# Patient Record
Sex: Female | Born: 1940 | Race: White | Hispanic: No | Smoking: Never smoker
Health system: Southern US, Community
[De-identification: ages and names within clinical notes are randomized; demographics above are authoritative.]

## PROBLEM LIST (undated history)

## (undated) DIAGNOSIS — R413 Other amnesia: Principal | ICD-10-CM

## (undated) DIAGNOSIS — R0789 Other chest pain: Secondary | ICD-10-CM

## (undated) DIAGNOSIS — S8992XA Unspecified injury of left lower leg, initial encounter: Secondary | ICD-10-CM

## (undated) DIAGNOSIS — K219 Gastro-esophageal reflux disease without esophagitis: Secondary | ICD-10-CM

## (undated) DIAGNOSIS — F329 Major depressive disorder, single episode, unspecified: Secondary | ICD-10-CM

## (undated) DIAGNOSIS — K635 Polyp of colon: Secondary | ICD-10-CM

## (undated) DIAGNOSIS — Z9889 Other specified postprocedural states: Secondary | ICD-10-CM

## (undated) DIAGNOSIS — I10 Essential (primary) hypertension: Secondary | ICD-10-CM

## (undated) DIAGNOSIS — M1990 Unspecified osteoarthritis, unspecified site: Secondary | ICD-10-CM

## (undated) DIAGNOSIS — J45909 Unspecified asthma, uncomplicated: Secondary | ICD-10-CM

## (undated) DIAGNOSIS — R112 Nausea with vomiting, unspecified: Secondary | ICD-10-CM

## (undated) DIAGNOSIS — F0390 Unspecified dementia without behavioral disturbance: Secondary | ICD-10-CM

## (undated) DIAGNOSIS — E538 Deficiency of other specified B group vitamins: Secondary | ICD-10-CM

## (undated) DIAGNOSIS — F32A Depression, unspecified: Secondary | ICD-10-CM

## (undated) HISTORY — PX: OTHER SURGICAL HISTORY: SHX169

## (undated) HISTORY — DX: Unspecified asthma, uncomplicated: J45.909

## (undated) HISTORY — DX: Unspecified injury of left lower leg, initial encounter: S89.92XA

## (undated) HISTORY — DX: Polyp of colon: K63.5

## (undated) HISTORY — DX: Other amnesia: R41.3

## (undated) HISTORY — DX: Other chest pain: R07.89

## (undated) HISTORY — DX: Deficiency of other specified B group vitamins: E53.8

## (undated) HISTORY — DX: Unspecified osteoarthritis, unspecified site: M19.90

## (undated) HISTORY — DX: Gastro-esophageal reflux disease without esophagitis: K21.9

---
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
## 1979-06-28 HISTORY — PX: DILATION AND CURETTAGE OF UTERUS: SHX78

## 1993-06-27 HISTORY — PX: CARPAL TUNNEL RELEASE: SHX101

## 1998-01-12 ENCOUNTER — Emergency Department (HOSPITAL_COMMUNITY): Admission: EM | Admit: 1998-01-12 | Discharge: 1998-01-12 | Payer: Self-pay | Admitting: Emergency Medicine

## 1998-06-01 ENCOUNTER — Other Ambulatory Visit: Admission: RE | Admit: 1998-06-01 | Discharge: 1998-06-01 | Payer: Self-pay | Admitting: *Deleted

## 1999-11-17 ENCOUNTER — Other Ambulatory Visit: Admission: RE | Admit: 1999-11-17 | Discharge: 1999-11-17 | Payer: Self-pay | Admitting: *Deleted

## 2000-01-03 ENCOUNTER — Ambulatory Visit (HOSPITAL_COMMUNITY): Admission: RE | Admit: 2000-01-03 | Discharge: 2000-01-03 | Payer: Self-pay | Admitting: Internal Medicine

## 2000-01-19 ENCOUNTER — Ambulatory Visit (HOSPITAL_COMMUNITY): Admission: RE | Admit: 2000-01-19 | Discharge: 2000-01-19 | Payer: Self-pay | Admitting: Cardiovascular Disease

## 2000-03-10 ENCOUNTER — Ambulatory Visit (HOSPITAL_COMMUNITY): Admission: RE | Admit: 2000-03-10 | Discharge: 2000-03-10 | Payer: Self-pay | Admitting: Gastroenterology

## 2000-03-10 ENCOUNTER — Encounter (INDEPENDENT_AMBULATORY_CARE_PROVIDER_SITE_OTHER): Payer: Self-pay | Admitting: Specialist

## 2000-04-27 ENCOUNTER — Encounter: Payer: Self-pay | Admitting: *Deleted

## 2000-04-27 ENCOUNTER — Encounter: Admission: RE | Admit: 2000-04-27 | Discharge: 2000-04-27 | Payer: Self-pay | Admitting: *Deleted

## 2000-05-08 ENCOUNTER — Encounter (INDEPENDENT_AMBULATORY_CARE_PROVIDER_SITE_OTHER): Payer: Self-pay | Admitting: *Deleted

## 2000-05-08 ENCOUNTER — Ambulatory Visit (HOSPITAL_COMMUNITY): Admission: RE | Admit: 2000-05-08 | Discharge: 2000-05-08 | Payer: Self-pay | Admitting: Gastroenterology

## 2000-07-05 ENCOUNTER — Encounter: Admission: RE | Admit: 2000-07-05 | Discharge: 2000-10-03 | Payer: Self-pay | Admitting: Anesthesiology

## 2001-06-05 ENCOUNTER — Encounter: Payer: Self-pay | Admitting: *Deleted

## 2001-06-05 ENCOUNTER — Encounter: Admission: RE | Admit: 2001-06-05 | Discharge: 2001-06-05 | Payer: Self-pay | Admitting: *Deleted

## 2001-06-27 HISTORY — PX: VAGINAL HYSTERECTOMY: SHX2639

## 2002-06-06 ENCOUNTER — Encounter: Admission: RE | Admit: 2002-06-06 | Discharge: 2002-06-06 | Payer: Self-pay | Admitting: Internal Medicine

## 2002-06-06 ENCOUNTER — Encounter (HOSPITAL_BASED_OUTPATIENT_CLINIC_OR_DEPARTMENT_OTHER): Payer: Self-pay | Admitting: Internal Medicine

## 2002-06-07 ENCOUNTER — Encounter: Admission: RE | Admit: 2002-06-07 | Discharge: 2002-06-07 | Payer: Self-pay | Admitting: Internal Medicine

## 2002-06-07 ENCOUNTER — Encounter (HOSPITAL_BASED_OUTPATIENT_CLINIC_OR_DEPARTMENT_OTHER): Payer: Self-pay | Admitting: Internal Medicine

## 2002-12-10 ENCOUNTER — Ambulatory Visit (HOSPITAL_BASED_OUTPATIENT_CLINIC_OR_DEPARTMENT_OTHER): Admission: RE | Admit: 2002-12-10 | Discharge: 2002-12-10 | Payer: Self-pay | Admitting: Orthopedic Surgery

## 2002-12-10 ENCOUNTER — Encounter (INDEPENDENT_AMBULATORY_CARE_PROVIDER_SITE_OTHER): Payer: Self-pay | Admitting: *Deleted

## 2003-12-12 ENCOUNTER — Encounter: Admission: RE | Admit: 2003-12-12 | Discharge: 2003-12-12 | Payer: Self-pay | Admitting: Internal Medicine

## 2003-12-18 ENCOUNTER — Encounter: Admission: RE | Admit: 2003-12-18 | Discharge: 2003-12-18 | Payer: Self-pay | Admitting: Internal Medicine

## 2004-06-27 HISTORY — PX: COLONOSCOPY: SHX174

## 2005-01-13 ENCOUNTER — Encounter: Admission: RE | Admit: 2005-01-13 | Discharge: 2005-01-13 | Payer: Self-pay | Admitting: Internal Medicine

## 2005-12-02 ENCOUNTER — Encounter: Admission: RE | Admit: 2005-12-02 | Discharge: 2005-12-02 | Payer: Self-pay | Admitting: Orthopedic Surgery

## 2005-12-27 ENCOUNTER — Encounter: Admission: RE | Admit: 2005-12-27 | Discharge: 2005-12-27 | Payer: Self-pay | Admitting: Orthopedic Surgery

## 2006-01-26 ENCOUNTER — Encounter: Admission: RE | Admit: 2006-01-26 | Discharge: 2006-01-26 | Payer: Self-pay | Admitting: Internal Medicine

## 2006-05-30 ENCOUNTER — Ambulatory Visit (HOSPITAL_BASED_OUTPATIENT_CLINIC_OR_DEPARTMENT_OTHER): Admission: RE | Admit: 2006-05-30 | Discharge: 2006-05-30 | Payer: Self-pay | Admitting: Orthopedic Surgery

## 2007-03-06 ENCOUNTER — Encounter: Admission: RE | Admit: 2007-03-06 | Discharge: 2007-03-06 | Payer: Self-pay | Admitting: Internal Medicine

## 2008-03-06 ENCOUNTER — Encounter: Admission: RE | Admit: 2008-03-06 | Discharge: 2008-03-06 | Payer: Self-pay | Admitting: Internal Medicine

## 2009-03-09 ENCOUNTER — Encounter: Admission: RE | Admit: 2009-03-09 | Discharge: 2009-03-09 | Payer: Self-pay | Admitting: Internal Medicine

## 2010-03-29 ENCOUNTER — Encounter: Admission: RE | Admit: 2010-03-29 | Discharge: 2010-03-29 | Payer: Self-pay | Admitting: Internal Medicine

## 2010-07-18 ENCOUNTER — Encounter (HOSPITAL_BASED_OUTPATIENT_CLINIC_OR_DEPARTMENT_OTHER): Payer: Self-pay | Admitting: Internal Medicine

## 2010-11-12 NOTE — Procedures (Signed)
[facility]  Patient:    Deborah Moreno, Deborah Moreno                      MRN: [id number] Proc. Date: 07/12/00 Adm. Date:  [id number] Attending:  Thyra Breed CC:         Lunette Stands, M.D.                           Procedure Report  PROCEDURE:  Lumbar epidural steroid injection.  DIAGNOSIS:  Disk protrusion at L4-5 affecting the right nerve root with facet joint arthropathy and degenerative disk bulge.  INTERVAL HISTORY:  The patient has noted some mild improvement after the first injection.  She tolerated the 40 mg well without untoward effects.  PHYSICAL EXAMINATION:  Blood pressure 147/75, heart rate 68, respiratory rate 20, O2 saturations 100%.  Pain level is 7/10, and temperature is 97 degrees. She shows good healing from previous injection site.  DESCRIPTION OF PROCEDURE:  After informed consent was obtained, the patient was placed in a sitting position and monitored.  Her back was prepped with Betadine x 3.  A skin wheal was raised at the L4-L5 interspace with 1% lidocaine.  A 20 gauge Tuohy needle was introduced to the lumbar epidural space to loss of resistance to preservative-free normal saline.  There was no CSF nor blood.  Medrol 80 mg in 6 mL of preservative-free normal saline was gently injected.  The needle was flushed with preservative-free normal saline and removed intact.  Postprocedure condition - stable.  DISCHARGE INSTRUCTIONS: 1. Resume previous diet. 2. Limitations on activities per instruction sheet. 3. Continue on current medications. 4. Follow up with me in one week for a repeat injection. DD:  07/12/00 TD:  07/13/00 Job: [id number]

## 2010-11-12 NOTE — Procedures (Signed)
[facility]  Patient:    Deborah Moreno, Deborah Moreno                      MRN: [id number] Proc. Date: 07/19/00 Adm. Date:  [id number] Attending:  Thyra Breed CC:         Lunette Stands, M.D.   Procedure Report  PROCEDURE:  Lumbar epidural steroid injection.  DIAGNOSIS:  Disk protrusion at L4-5 affecting the right nerve root with facet joint arthropathy and degenerative disk disease.  INTERVAL HISTORY:  The patient has noted mild improvement in her back discomfort after the first two injections but wishes to proceed with the third today.  She rates her pain at 7/10 which is identical to her last visit.  She had no untoward effects.  PHYSICAL EXAMINATION:  Blood pressure 126/46, heart rate 67, respiratory rate 20, O2 saturations 100%.  Pain level is 6-7/10, and temperature is 97.3.  Her back shows good healing from previous injection site.  DESCRIPTION OF PROCEDURE:  After informed consent was obtained, the patient was placed in a sitting position and monitored.  Her back was prepped with Betadine x 3.  A skin wheal was raised at the L4-L5 interspace with 1% lidocaine.  A 20 gauge Tuohy needle was introduced in the lumbar epidural space to loss of resistance to preservative-free normal saline.  There was no CSF nor blood.  Medrol 80 mg in 8 mL of preservative-free normal saline was gently injected.  The needle was flushed with preservative-free normal saline and removed intact.  Postprocedure condition - stable.  DISCHARGE INSTRUCTIONS: 1. Resume previous diet. 2. Limitations on activities per instruction sheet. 3. Continue on current medications. 4. The patient plans to follow up with Dr. Charlett Blake. DD:  07/19/00 TD:  07/19/00 Job: [id number]

## 2010-11-12 NOTE — Procedures (Signed)
[facility]  Patient:    Deborah Moreno, Deborah Moreno                      MRN: [id number] Proc. Date: 03/10/00 Adm. Date:  [id number] Disc. Date: [id number] Attending:  Rich Brave CC:         Barry Dienes. Eloise Harman, M.D.   Procedure Report  PROCEDURE:  Upper endoscopy with Savary dilatation of the esophagus.  INDICATIONS FOR PROCEDURE:  A 70 year old female with recurrent dysphagia symptoms, now several years status post a previous dilatation to 18 mm.  FINDINGS:  Distal esophageal ring dilated to 18 mm.  DESCRIPTION OF PROCEDURE:  The nature, purpose and risk of the procedure were familiar to the patient from her prior procedure and she provided written consent. Sedation was Demerol 50 mg and Versed 5 mg IV without arrhythmias or desaturation. The Olympus video endoscope was passed under direct vision. I did not appreciate any overt laryngeal abnormalities. The esophagus was endoscopically without evidence of reflux esophagitis, free reflux, Barretts esophagus, varices, infection, or neoplasia. There was a fairly widely patent esophageal ring at the squamocolumnar junction and below this was a minimal hiatal hernia. The stomach was entered. It contained no significant residual and had normal mucosa without evidence of gastritis, erosions, ulcers, polyps or masses including retroflexed view of the proximal stomach and the pylorus, duodenal bulb, and second duodenum looked normal.  Savary dilatation was performed in the standard fashion. The spring-tip guidewire was passed through the scope into the antrum of the stomach, the scope was removed in an exchange fashion and a single passage was made with an 18 mm Savary dilator under fluoroscopic guidance, confirming that he wire was in appropriate position and that the widest portion of the dilator went below the level of the diaphragm. There was fairly smooth resistance with passage of the dilator, no  discreet "pop" or "click". The dilator and the guidewire were removed and the patient was then re-endoscoped under direct vision which disclosed some fresh blood coating the distal esophagus consistent with fracture of the ring or dilation of a narrowed area of the esophageal mucosa. There was no endoscopic evidence of undue trauma to the hypopharynx or esophagus. Specifically, I did not see anything suspicious for esophageal perforation.  The scope was then removed from the patient who tolerated the procedure well and without apparent complication.  IMPRESSION:  Esophageal ring dilated to 18 mm as described above.  PLAN:  Clinical follow-up of dysphagia symptoms. DD:  03/10/00 TD:  03/13/00 Job: [id number]

## 2010-11-12 NOTE — Procedures (Signed)
. [facility]  Patient:    Deborah Moreno, Deborah Moreno                      MRN: [id number] Proc. Date: 05/08/00 Adm. Date:  [id number] Disc. Date: [id number] Attending:  Rich Brave CC:         Dr. Brunilda Payor   Procedure Report  PROCEDURE:  Colonoscopy with hot biopsy.  INDICATIONS:  Adenoma seen on screening flexible sigmoidoscopy.  FINDINGS:  A 2-3 mm residual polyp at 38 cm, hot biopsied.  Otherwise normal examination to cecum.  PROCEDURE:  The nature, purpose, and risks of the procedure had been discussed with the patient, who provided written consent.  Sedation fentanyl 70 mcg and Versed 6 mg IV without arrhythmias or desaturation.  The Olympus adjustable-tension video colonoscope was advanced quite easily to the cecum as identified by typical cecal appearance and pullback was then performed in a gradual fashion.  On the way in, there was a 2-3 mm residual polyp at the site of previous biopsies obtained during her screening flexible sigmoidoscopy.  This polyp was hot biopsied with good coagulation, but no evidence of excessive coagulation or bleeding.  The quality of the prep during this examination was excellent and it was felt that no significant lesions would have been missed.  Other than the above mentioned polyp, no additional polyps were seen nor was there any evidence of cancer, colitis, vascular malformations or diverticular disease.  Retroflexion could not readily be accomplished in the rectum, but careful antegrade viewing disclosed only moderate internal hemorrhoids.  The patient tolerated this procedure well and there were no apparent complications.  IMPRESSION: 1. Colon polyp removed as described above. 2. Mild ______ internal hemorrhoids.  PLAN:  Await pathology on biopsy.  Consider follow-up colonoscopy in five years if adenomatous in character. DD:  05/08/00 TD:  05/08/00 Job: [id number]

## 2010-11-12 NOTE — Op Note (Signed)
NAME:  Deborah Moreno, Deborah Moreno       ACCOUNT NO.:  [id number]   MEDICAL RECORD NO.:  [id number]          PATIENT TYPE:  AMB   LOCATION:  DSC                          FACILITY:  [facility]   PHYSICIAN:  Katy Fitch. Sypher, M.D. 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   OPERATION:  1. Resection of pre tendinous fibers of palmar fascia to relax      contracture.  2. Release of right long finger A1 pulley.   OPERATING SURGEON:  Josephine Igo, MD   ASSISTANT:  Annye Rusk PA-C   ANESTHESIA:  2% lidocaine metacarpal head level block supplemented by IV  sedation, supervising anesthesiologist is Dr. Jean Rosenthal.   INDICATIONS:  Deborah Moreno is a 70 year old woman referred through the  courtesy of Dr. Dossie Arbour for evaluation and management of a  stenosing tenosynovitis of the right long finger.  She has a history of  triggering and contracture of the pre tendinous fibers of the palmar  fascia.   We advised that we would release the A1 pulley and resect a portion of  the pre tendinous fibers to allow recovery of full extension of the MP  joint.  After informed consent, she is brought to the operating room at  this time.   PROCEDURE:  Deborah Moreno is brought to the operating room and placed in  supine position operating table.   Following light sedation the right arm was prepped with Betadine soap  solution, sterilely draped.  A pneumatic tourniquet was applied proximal  right brachium.   Following exsanguination of right arm with Esmarch bandage, arterial  tourniquet inflated 220 mmHg.  The procedure  commenced with infiltration  of 2% lidocaine in the path the intended incision and the flexor sheath  of the right long finger.   When anesthesia satisfactory, procedure commenced with a short oblique  incision in the distal palmar crease.  A 15 mm long segment of the pre  tendinous fibers of the palmar fascia was dissected from the deep side  of the dermis.  The cutaneous ligaments released followed by release of  the septa and the fascia was excised piecemeal.  Thereafter the A1  pulley was isolated and split with scissors along its radial border.  The flexor tendons were delivered and found be invested and fibrotic  tenosynovium.  This was dissected with a rongeur.   Thereafter free range of motion of the finger was recovered from full  extension to flexion to the distal palmar crease.   The wound was then repaired with mattress sutures of 5-0 nylon.  There  were no apparent complications.   Deborah Moreno tolerated surgery anesthesia well.  She is transferred to  recovery with stable signs.   She will be discharged to the care her family with a prescription for  Darvocet N 100 one by mouth every four to six hours as needed for pain.  20 tablets without refill.      Katy Fitch Sypher, M.D.  Electronically Signed     RVS/MEDQ  D:  05/30/2006  T:  05/31/2006  Job:  [id number]

## 2010-11-12 NOTE — Cardiovascular Report (Signed)
[place]  Patient:    Deborah Moreno, Deborah Moreno                        MRN: [id number] Proc. Date: 01/19/00 Attending:  Alvia Grove., M.D. CC:         Cardiac Cath Lab             Dr. Brunilda Payor                        Cardiac Catheterization  INDICATIONS:  The patient is a 70 year old female with a positive stress test. She is referred for heart catheterization for further evaluation of her chest pain and positive stress test.  DESCRIPTION OF PROCEDURE:  The right femoral artery was easily cannulated using the modified Seldinger technique.  At the end of the case, an angiogram revealed that the puncture site was just above or perhaps right at the bifurcation.  The sheaths were removed and manual pressure was held.  HEMODYNAMICS:  Left ventricular pressure 125/17 with an aortic pressure of 124/73.  ANGIOGRAPHY:  The left main coronary artery is smooth and normal.  The left anterior descending artery is a relatively large vessel.  It gives off multiple small diagonal branches.  The entire LAD and diagonal system is smooth and normal.  The left circumflex artery is a large vessel.  It supplies mainly a first obtuse marginal distribution.  This vessel was normal throughout its course.  The right coronary artery is a large and dominant vessel.  It gives off several small R-V marginal branches.  It then supplies a _______ the posterior descending artery as well as the posterolateral segment artery.  All of these branches are normal.  The left ventriculogram was performed in a 30 RAO position.  It revealed overall normal left ventricular systolic function.  There is no mitral regurgitation.   There is normal contractility otherwise.  Angiogram of the right femoral artery was done to assess the patient for the possibility of a Perclose device.  It was determined that the puncture site was right at or perhaps just slightly above the bifurcation  of the femoral artery.  We elected not to Perclose the artery.  The artery was closed using manual pressure.  COMPLICATIONS:  None.  CONCLUSIONS: 1. ______ and normal coronary arteries. 2. Normal left ventricular systolic function. DD:  01/19/00 TD:  01/20/00 Job: [id number]

## 2010-11-12 NOTE — Procedures (Signed)
[facility]  Patient:    Deborah Moreno, Deborah Moreno                        MRN: [id number] Proc. Date: 07/05/00 Attending:  Thyra Breed, M.D. CC:         Barry Dienes. Eloise Harman, M.D.  Lunette Stands, M.D.   Procedure Report  DATE OF BIRTH:  [birth year]  PROCEDURE:  Lumbar epidural steroid injection.  DIAGNOSES:  Lumbar degenerative disk disease with disk protrusion at 4-5 to the right affecting the right nerve root. She also some facet joint arthropathy.  HISTORY OF PRESENT ILLNESS:  Bijou is a 70 year old patient who is sent to [place] by Dr. Lunette Stands for a series of lumbar epidural steroid injections. The patient stated that she noted the gradual onset of lower back discomfort beginning about after doing some heavy yard work. Previously she had been taking celebrex for osteoarthritis involving the knees and shoulders and noted that from August on this was less effective in controlling her discomfort. She saw Dr. 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 Because her pain persisted, she had x-rays performed of her back which suggested that she had degenerative disk disease and she was placed on a prednisone dosepak which helped mildly. She was put through physical therapy for 2 weeks and noted no improvement except when she did pelvic tilt exercises. She was placed back on celebrex which was not effective and then Mobic. During the summer months, she also underwent a coronary catheterization which was performed via the right femoral artery and had some discomfort in her right groin which persisted from then. An MRI was performed on June 15, 2000 which demonstrated degenerative disk disease with disk protrusion at 4-5 to the right affecting the right neuroforamen at that level and some posterior element hypertrophy. She is sent for a series of lumbar epidural steroid injections at this time.  She describes her pain as a sharp burning  pain in her right buttock radiating out into the groin region at times. She denied any weakness or bowel or bladder incontinence. She has had persistent numbness of her feet for years. Her pain is made worse by leaning over and arising from a leaning position or sitting for long periods of time and is improved by laying on her stomach.  CURRENT MEDICATIONS:  Premarin, celebrex, Toprol and albuterol.  ALLERGIES:  No known drug allergies.  FAMILY HISTORY:  Positive for emphysema, goiter, breast caner, osteoarthritis and Alzheimers.  ACTIVE MEDICAL PROBLEMS:  Asthma, hypertension, osteoarthritis.  SOCIAL HISTORY:  The patient is a nonsmoker, nondrinker. 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  PAST SURGICAL HISTORY:  Significant for polypectomy, interventions for schatzki ring by Dr. Matthias Hughs and her coronary catheterization.  REVIEW OF SYSTEMS:  GENERAL:  Negative.  HEAD:  Negative. EYES:  Negative. NOSE/MOUTH/THROAT:  Negative. EARS:  Negative. PULMONARY: Significant for asthma since 70 years old. CARDIOVASCULAR:  Chronic hypertension. GI: Significant for gastroesophageal reflux type symptoms. GU:  Significant for urgency. MUSCULOSKELETAL:  Significant for knee, shoulder, and foot pain with history of rotator cuff tears. NEUROLOGIC:  See HPI for pertinent positives. HEMATOLOGIC:  Negative. ENDOCRINE: Negative. CUTANEOUS:  Negative. PSYCHIATRIC:  Negative. ALLERGY/IMMUNOLOGIC:  Positive for multiple allergies. BACK: See HPI.  PHYSICAL EXAMINATION:  VITAL SIGNS:  Blood pressure is 143/67, heart rate 74, respirations 16, O2 saturations 99%, pain level is 8/10.  GENERAL:  This is a pleasant female in no acute  distress.  HEENT:  Head was normocephalic, atraumatic. Eyes, extraocular movements intact. Conjunctivae and sclerae clear. Nose patent nares without discharge. Oropharynx was free of lesions.  NECK:  Supple without lymphadenopathy. Carotids are 2+ and symmetric  without bruits.  LUNGS:  Clear.  HEART:  Regular rate and rhythm.  BREASTS/ABDOMINAL/PELVIC/RECTAL:  Not performed.  BACK:  Revealed no tenderness to palpation over the posterior superior iliac spine nor paraspinous tenderness. Straight leg raise signs were negative.  EXTREMITIES:  The patient demonstrated bony enlargement of the first carpal metacarpal joints of the hands as well as the PIPs and DIPs. She exhibited some mild bony changes of the metatarsal joints as well as the first MTPs. Radial pulses and dorsalis pedis pulses were 2+ and symmetric.  NEUROLOGIC:  The patient was oriented x 4. Cranial nerves 2-12 were grossly intact. Deep tendon reflexes were symmetric in the upper and lower extremity with downgoing toes. Motor was 5/5 with symmetric bulk and tone. Sensation was significant for intact pinprick with attenuated vibratory sense in the right lower extremity relative to the left. Coordination was grossly intact.  IMPRESSION: 1. Low back pain with MRI demonstrating lumbar spondylosis characterized by    degenerative disk disease and facet joint arthropathy with a disk    protrusion to the right at the L4 nerve root. 2. Other medical problems per Dr. Jarome Matin which include asthma,    and hypertension. 3. Gastroesophageal reflux disease and Schatzki ring per Dr. Matthias Hughs.  DISPOSITION:  I discussed the potential risks, benefits and limitations of a lumbar epidural steroid injection in detail with the patient as well as reviewed the side effects of corticosteroids in detail. The patient is interested in proceeding with the epidural.  DESCRIPTION OF PROCEDURE:  After informed consent was obtained, the patient was placed in the sitting position and monitored. The patients back was prepped with Betadine x 3. A skin wheal was raised at the L4-5 interspace with 1 percent lidocaine. A 20 gauge Tuohy needle was introduced to the lumbar  epidural space to loss of  resistance to preservative free normal saline. There was no cerebrospinal fluid nor blood. 40 mg of Medrol and 10 ml of preservative free normal saline was gently injected. The needle was flushed with preservative free normal saline and removed intact.  CONDITION POST PROCEDURE:  Stable.  DISCHARGE INSTRUCTIONS:  Resume previous diet. Limitations in activities per instruction sheet as outlined by my assistant today. Continue on current medications. Follow-up with me in 1-2 weeks for repeat injection. DD:  07/05/00 TD:  07/05/00 Job: [id number]

## 2010-11-12 NOTE — Op Note (Signed)
NAME:  Deborah Moreno, Deborah Moreno                         ACCOUNT NO.:  [id number]   MEDICAL RECORD NO.:  [id number]                   PATIENT TYPE:  AMB   LOCATION:  DSC                                  FACILITY:  [facility]   PHYSICIAN:  Katy Fitch. Naaman Plummer., M.D.          DATE OF BIRTH:  [birth year]   DATE OF PROCEDURE:  12/10/2002  DATE OF DISCHARGE:                                 OPERATIVE REPORT   PREOPERATIVE DIAGNOSES:  1. Left carpal tunnel syndrome.  2. Left volar ganglion overlying the scaphoid volar aspect.  3. Chronic stenosing tenosynovitis, right ring finger, flexor sheath.   POSTOPERATIVE DIAGNOSES:  1. Left carpal tunnel syndrome.  2. Left volar ganglion overlying the scaphoid volar aspect.  3. Chronic stenosing tenosynovitis, right ring finger, flexor sheath.   OPERATION:  1. Release of left transverse carpal ligament -  code [phone].  2. Excision of left volar ganglion -  code #[id number].  Note that this is     through a separate incision.  3. Injection of left ring finger flexor sheath.   SURGEON:  Katy Fitch. Sypher, M.D.   ASSISTANT:  Marveen Reeks. Dasnoit, P.A.-C.   ANESTHESIA:  General by LMA.   ANESTHESIOLOGIST:  Sheldon Silvan, M.D.   INDICATIONS FOR PROCEDURE:  The patient is a 70 year old woman referred for  the evaluation and management of carpal tunnel syndrome, a volar ganglion on  the left, and triggering of her right ring finger.   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  Due to a failure to responsive to non-operative measures, she is brought to  the operating room at this time for the release of her left transverse  carpal ligament, and through a separate incision, the excision of a left  volar ganglion, as well as injection of her right ring finger flexor  sheath.   DESCRIPTION OF PROCEDURE:  After an informed consent, the patient  is  brought to the operating room at this time and placed in the supine position  upon the operating room table.  Following the induction of general  anesthesia by LMA, the left arm was prepped with Betadine soap and solution  and sterilely draped.  The palm overlying the flexor sheath of the right  ring finger was prepped with alcohol, and a mixture of 1 mL of 40 mg per mL  of Depo-Medrol and 1 mL of plain lidocaine 1% was injected into the flexor  sheath.  The palmar injection site was dressed with a Band-Aid.   Attention was then directed to the left arm which was prepped with Betadine  soap and solution and sterilely draped.  After exsanguination with an  Esmarch bandage, the arterial tourniquet on the proximal brachium was  inflated to  250 mmHg.  The procedure commenced with a short incision in the  line of the ring finger in the palm.  The subcutaneous tissues were  carefully divided around the palmar fascia.  This was split longitudinally  through the common extensor branch of the medial nerve.  These were followed  back to the transverse carpal ligament which was carefully isolated from the  median nerve.  The ligament was released along its ulnar border, extending  into the distal forearm.  This widely opened the carpal canal.  No masses or  other predicaments are noted.  The bleeding points along the margin of the  released ligament are electrocauterized with bipolar current, followed by a  repair of the skin with intradermal #3-0 Prolene suture.   Attention was then directed to the volar aspect of the wrist.  A ganglion  deep to the fascia, radial to the flexor carpi radialis was palpated.  A  short transverse incision was fashioned.  Care was taken to gently dissect  free the radial artery proper, its dorsal branch and superficial palmar  arch.  The ganglion involved the accompanying veins of the  radial artery,  particularly the superficial branch, and the branches to the scaphoid.  The  ganglion sac was circumferentially dissected down to the capsule of the  wrist joint, and amputated at the exit point from the wrist joint.  A  rongeur was used to clear all remnants of the membrane.  Bleeding points  along the volar wrist capsule were electrocauterized with bipolar current,  followed by irrigation of the wound.   The tourniquet was released.  There was no sign of any bleeding from the  radial artery.  One small vein was sacrificed with the ganglion as it was  intimately involved with the wall of the ganglion.  The specimen was passed  for pathological evaluation.  The wound was then repaired with intradermal  #3-0 Prolene and a Steri-Strip.   A compressive dressing was applied to the hand with a volar plaster splint  in standard fashion in five degrees of dorsiflexion.  There were no apparent  complications.                                                Katy Fitch Naaman Plummer., M.D.    RVS/MEDQ  D:  12/10/2002  T:  12/10/2002  Job:  [id number]   cc:   Anesthesia Department  -  [facility]

## 2010-11-12 NOTE — Procedures (Signed)
[facility]  Patient:    Deborah Moreno, Deborah Moreno                      MRN: [id number] Proc. Date: 03/10/00 Adm. Date:  [id number] Disc. Date: [id number] Attending:  Rich Brave CC:         Barry Dienes. Eloise Harman, M.D.   Procedure Report  PROCEDURE:  Flexible sigmoidoscopy with biopsy.  INDICATIONS FOR PROCEDURE:  Screening for colon cancer in a 70 year old female.  FINDINGS:  Small to medium size polyp at 38 cm.  DESCRIPTION OF PROCEDURE:  The nature, purpose and risks of the procedure were familiar to the patient from previous examination and she provided written consent. Sedation for this procedure and the upper endoscopy which preceded it totaled Demerol 50 mg and Versed 6 mg IV without arrhythmias or desaturation.  The Olympus pediatric video colon was advanced to the region of the hepatic flexure, benefiting from an excellent prep. Pullback was then performed because I started to encounter some stool coating the colonic lumen.  At about 38 cm as measured on pullback, there was a 4 mm semipedunculated benign-appearing polyp, of which I took 2 biopsies. No other polyps were seen up to the limit of the exam, nor was there any evidence of colitis, vascular malformations, colon cancer or diverticular disease. Retroflexion in the rectum could not be accomplished due to a small rectal ampulla.  The patient tolerated the procedure well and there were no apparent complications.  IMPRESSION:  Polyp in the distal descending or proximal sigmoid colon, biopsied as described above.  PLAN:  Await pathology on the polyp with anticipated elective colonoscopy if the patient turns out to be adenomatous in character. DD:  03/10/00 TD:  03/13/00 Job: [id number]

## 2011-04-21 ENCOUNTER — Other Ambulatory Visit: Payer: Self-pay | Admitting: Internal Medicine

## 2011-04-21 DIAGNOSIS — Z1231 Encounter for screening mammogram for malignant neoplasm of breast: Secondary | ICD-10-CM

## 2011-05-13 ENCOUNTER — Ambulatory Visit
Admission: RE | Admit: 2011-05-13 | Discharge: 2011-05-13 | Disposition: A | Discharge status: Home / Self Care | Payer: Medicare Other | Source: Ambulatory Visit | Attending: Internal Medicine | Admitting: Internal Medicine

## 2011-05-13 DIAGNOSIS — Z1231 Encounter for screening mammogram for malignant neoplasm of breast: Secondary | ICD-10-CM

## 2013-05-08 ENCOUNTER — Other Ambulatory Visit: Payer: Self-pay

## 2013-05-08 ENCOUNTER — Other Ambulatory Visit: Payer: Self-pay | Admitting: Internal Medicine

## 2013-05-08 DIAGNOSIS — Z1231 Encounter for screening mammogram for malignant neoplasm of breast: Secondary | ICD-10-CM

## 2013-05-15 ENCOUNTER — Other Ambulatory Visit: Payer: Self-pay | Admitting: Internal Medicine

## 2013-05-15 DIAGNOSIS — Z1231 Encounter for screening mammogram for malignant neoplasm of breast: Secondary | ICD-10-CM

## 2013-05-15 DIAGNOSIS — Z803 Family history of malignant neoplasm of breast: Secondary | ICD-10-CM

## 2013-06-17 ENCOUNTER — Ambulatory Visit
Admission: RE | Admit: 2013-06-17 | Discharge: 2013-06-17 | Disposition: A | Discharge status: Home / Self Care | Payer: Medicare Other | Source: Ambulatory Visit | Attending: Internal Medicine | Admitting: Internal Medicine

## 2013-06-17 DIAGNOSIS — Z803 Family history of malignant neoplasm of breast: Secondary | ICD-10-CM

## 2013-06-17 DIAGNOSIS — Z1231 Encounter for screening mammogram for malignant neoplasm of breast: Secondary | ICD-10-CM

## 2013-12-31 ENCOUNTER — Encounter: Payer: Self-pay | Admitting: Neurology

## 2014-01-02 ENCOUNTER — Encounter: Payer: Self-pay | Admitting: Neurology

## 2014-01-02 ENCOUNTER — Ambulatory Visit (INDEPENDENT_AMBULATORY_CARE_PROVIDER_SITE_OTHER): Payer: Medicare Other | Admitting: Neurology

## 2014-01-02 VITALS — BP 145/73 | HR 62 | Ht <= 58 in | Wt 90.0 lb

## 2014-01-02 DIAGNOSIS — D518 Other vitamin B12 deficiency anemias: Secondary | ICD-10-CM

## 2014-01-02 DIAGNOSIS — R6889 Other general symptoms and signs: Secondary | ICD-10-CM

## 2014-01-02 DIAGNOSIS — D511 Vitamin B12 deficiency anemia due to selective vitamin B12 malabsorption with proteinuria: Secondary | ICD-10-CM

## 2014-01-02 DIAGNOSIS — R413 Other amnesia: Secondary | ICD-10-CM | POA: Insufficient documentation

## 2014-01-02 HISTORY — DX: Other amnesia: R41.3

## 2014-01-02 NOTE — Patient Instructions (Signed)

## 2014-01-02 NOTE — Progress Notes (Signed)
Reason for visit: Memory disorder  Deborah RoysLynda Moreno is a 73 y.o. female  History of present illness:  Deborah Moreno is a 73 year old right-handed white female with a history of a memory disorder that has been present at least since June 2013. The patient was placed on Aricept at that time. The patient has had some gradual progression of memory problems. 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 Since that time, she has been living with her son. The patient is operating a motor vehicle, and she will have occasional episodes where she may get turnaround with directions. She has had some problems with accurately paying the bills over the last one year. The patient has required some assistance with keeping up with medications and appointments. She will repeat herself frequently, and she has issues with short-term memory. The patient denies any focal numbness or weakness of the face, arms, or legs. She denies issues controlling the bowels or the bladder. She denies any fatigue or problems with sleep. She has had some mild issues with depression, and she was placed on Prozac, but she does not take this medication. The patient is sent to this office for further evaluation. She indicates that she has a history of vitamin B12 deficiency, but she does not take vitamin B12 supplementation consistently.  Past Medical History  Diagnosis Date  . Childhood asthma   . Colon polyps   . Atypical chest pain   . GERD (gastroesophageal reflux disease)   . Left knee injury     DISLOCATION  . 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 Degenerative arthritis   . Vitamin B12 deficiency     Past Surgical History  Procedure Laterality Date  . Volar ganglion cyst      INJECTION  . Vaginal hysterectomy  2003  . Carpal tunnel release Right 1995  . Dilation and curettage of uterus  1981    1990  . Tonsillectomy and adenoidectomy  1944  . Colonoscopy  2006    OCT    Family History  Problem  Relation Age of Onset  . Breast cancer Mother   . Parkinsonism Father   . Alzheimer's disease Father   . Dementia Father     Social history:  reports that she has never smoked. She has never used smokeless tobacco. She reports that she does not drink alcohol or use illicit drugs.  Medications:  Current Outpatient Prescriptions on File Prior to Visit  Medication Sig Dispense Refill  . albuterol (PROVENTIL HFA;VENTOLIN HFA) 108 (90 BASE) MCG/ACT inhaler Inhale 2 puffs into the lungs 3 (three) times daily as needed for wheezing or shortness of breath.      . mometasone (NASONEX) 50 MCG/ACT nasal spray Place 1-2 sprays into the nose daily.       No current facility-administered medications on file prior to visit.      Allergies  Allergen Reactions  . Anesthetics, Halogenated   . Lovastatin Other (See Comments)    MUSCLE ACHES    ROS:  Out of a complete 14 system review of symptoms, the patient complains only of the following symptoms, and all other reviewed systems are negative.  Weight loss Hearing loss, dizziness Memory disturbance Joint pain Depression, change in appetite  Blood pressure 145/73, pulse 62, height 3\' 11"  (1.194 m), weight 90 lb (40.824 kg).  Physical Exam  General: The patient is alert and cooperative at the time of the examination.  Eyes: Pupils are equal, round, and  reactive to light. Discs are flat bilaterally.  Neck: The neck is supple, no carotid bruits are noted.  Respiratory: The respiratory examination is clear.  Cardiovascular: The cardiovascular examination reveals a regular rate and rhythm, no obvious murmurs or rubs are noted.  Skin: Extremities are without significant edema.  Neurologic Exam  Mental status: The Mini-Mental status examination done today shows a total score of 25/30.  Cranial nerves: Facial symmetry is present. There is good sensation of the face to pinprick and soft touch bilaterally. The strength of the facial muscles  and the muscles to head turning and shoulder shrug are normal bilaterally. Speech is well enunciated, no aphasia or dysarthria is noted. Extraocular movements are full. Visual fields are full. The tongue is midline, and the patient has symmetric elevation of the soft palate. No obvious hearing deficits are noted.  Motor: The motor testing reveals 5 over 5 strength of all 4 extremities. Good symmetric motor tone is noted throughout.  Sensory: Sensory testing is intact to pinprick, soft touch, vibration sensation, and position sense on all 4 extremities. No evidence of extinction is noted.  Coordination: Cerebellar testing reveals good finger-nose-finger and heel-to-shin bilaterally.  Gait and station: Gait is normal. Tandem gait is normal. Romberg is negative. No drift is seen.  Reflexes: Deep tendon reflexes are symmetric and normal bilaterally. Toes are downgoing bilaterally.   Assessment/Plan:  1. Memory disturbance  The patient has had a slow progression in memory over time. The patient has been told that she has a vitamin B12 deficiency, and she has not been on supplementation consistently. The patient will have blood work done today, and she will undergo MRI evaluation of the brain. She is to remain on Aricept, and take the medication in the evening hours. Namenda may be added to this in the future. She will followup in 6 months.  Marlan Palau MD 01/02/2014 8:12 PM  [facility] [address] [place], [place] [address]  Phone [phone] Fax [phone]

## 2014-01-04 LAB — TSH: TSH: 2.14 u[IU]/mL (ref 0.450–4.500)

## 2014-01-04 LAB — METHYLMALONIC ACID, SERUM: Methylmalonic Acid: 133 nmol/L (ref 0–378)

## 2014-01-04 LAB — RPR: SYPHILIS RPR SCR: NONREACTIVE

## 2014-01-04 LAB — VITAMIN B12: Vitamin B-12: 484 pg/mL (ref 211–946)

## 2014-01-04 LAB — COPPER, SERUM: COPPER: 80 ug/dL (ref 72–166)

## 2014-01-11 ENCOUNTER — Ambulatory Visit
Admission: RE | Admit: 2014-01-11 | Discharge: 2014-01-11 | Disposition: A | Discharge status: Home / Self Care | Payer: Medicare Other | Source: Ambulatory Visit | Attending: Neurology | Admitting: Neurology

## 2014-01-11 DIAGNOSIS — R413 Other amnesia: Secondary | ICD-10-CM

## 2014-01-13 ENCOUNTER — Telehealth: Payer: Self-pay | Admitting: Neurology

## 2014-01-13 NOTE — Telephone Encounter (Signed)
  I called the patient. The MRI the brain does show some very mild small vessel ischemic changes, no acute changes are seen. The patient is on Aricept, blood work done previously was normal, we will consider adding Namenda in the future.    MRI brain 01/13/2014:  Impression   Mildly abnormal MRI scan of the brain showing mild changes of  supratentorial cortical atrophy and chronic microvascular ischemia.

## 2014-01-23 ENCOUNTER — Telehealth: Payer: Self-pay | Admitting: Neurology

## 2014-01-23 NOTE — Telephone Encounter (Signed)
Patient's daughter Lupita LeashDonna calling to request patient's MRI results, please call and advise.

## 2014-01-23 NOTE — Telephone Encounter (Signed)
I called the daughter, left a message, talked about the MRI brain results, minimal small vessel disease. We can add Namenda if they desire. They will call me concerning this if they want the medication.

## 2014-01-23 NOTE — Telephone Encounter (Signed)
Patient's daughter would like MRI results:  I called the patient. The MRI the brain does show some very mild small vessel ischemic changes, no acute changes are seen. The patient is on Aricept, blood work done previously was normal, we will consider adding Namenda in the future.

## 2014-01-24 ENCOUNTER — Telehealth: Payer: Self-pay | Admitting: Neurology

## 2014-01-24 MED ORDER — MEMANTINE HCL 28 X 5 MG & 21 X 10 MG PO TABS: ORAL_TABLET | ORAL | Status: DC

## 2014-01-24 NOTE — Telephone Encounter (Signed)
Previous phone note says:  York Spanielharles K Willis, MD at 01/23/2014 4:56 PM     Status: Signed        I called the daughter, left a message, talked about the MRI brain results, minimal small vessel disease. We can add Namenda if they desire. They will call me concerning this if they want the medication.   Rx has been entered, forwarding to provider for approval.

## 2014-01-24 NOTE — Telephone Encounter (Signed)
Patient's daughter calling to confirm that yes they would like to try Namenda for patient and to have it called in to the [facility] on [address]. If questions, please call.

## 2014-01-24 NOTE — Telephone Encounter (Signed)
I called the daughter. We will call in a titration pack for the Namenda. Once she is up on the full dose, the family is to contact our office, and I will call in a maintenance dose for the medication.

## 2014-05-14 ENCOUNTER — Encounter: Payer: Self-pay | Admitting: Neurology

## 2014-05-14 ENCOUNTER — Other Ambulatory Visit: Payer: Self-pay | Admitting: Internal Medicine

## 2014-05-14 DIAGNOSIS — Z1231 Encounter for screening mammogram for malignant neoplasm of breast: Secondary | ICD-10-CM

## 2014-05-20 ENCOUNTER — Encounter: Payer: Self-pay | Admitting: Neurology

## 2014-06-18 ENCOUNTER — Ambulatory Visit
Admission: RE | Admit: 2014-06-18 | Discharge: 2014-06-18 | Disposition: A | Discharge status: Home / Self Care | Payer: Medicare Other | Source: Ambulatory Visit | Attending: Internal Medicine | Admitting: Internal Medicine

## 2014-06-18 DIAGNOSIS — Z1231 Encounter for screening mammogram for malignant neoplasm of breast: Secondary | ICD-10-CM

## 2014-07-07 ENCOUNTER — Encounter: Payer: Self-pay | Admitting: Adult Health

## 2014-07-07 ENCOUNTER — Ambulatory Visit (INDEPENDENT_AMBULATORY_CARE_PROVIDER_SITE_OTHER): Payer: Medicare Other | Admitting: Adult Health

## 2014-07-07 ENCOUNTER — Telehealth: Payer: Self-pay | Admitting: Neurology

## 2014-07-07 VITALS — BP 133/72 | HR 65 | Ht 59.0 in | Wt 98.0 lb

## 2014-07-07 DIAGNOSIS — R413 Other amnesia: Secondary | ICD-10-CM

## 2014-07-07 MED ORDER — DONEPEZIL HCL 10 MG PO TABS: 10.0000 mg | ORAL_TABLET | Freq: Every day | ORAL | Status: DC

## 2014-07-07 NOTE — Progress Notes (Signed)
PATIENT: Deborah Moreno DOB: Jan 30, 1941  REASON FOR VISIT: follow up HISTORY FROM: patient CHIEF COMPLAINT: MEMORY  HISTORY OF PRESENT ILLNESS: Deborah Moreno is a 74 year old female with a history of memory disorder since June 2013. She returns today for follow-up. She is currently taking Aricept and tolerating it well. She was taken off Namenda by MD at [facility]. She was started on Remeron for depression. She lives with her son. She is able to complete all ADLs independently. She is able to operate a motor vehicle without difficulty. She states that sometimes she will question how to get to places. She states that she does most of the cooking. She does her own finances. Her son will oversee what she is doing. She has been on Remeron since September and feels that she isn't any worse. She states that Dr. Evlyn Kanner recently put her on Zoloft however the pharmacy told her not to take this medication with Remeron. No new issues since the last.   HISTORY 01/02/14 Anne Hahn): Deborah Moreno is a 74 year old right-handed white female with a history of a memory disorder that has been present at least since June 2013. The patient was placed on Aricept at that time. The patient has had some gradual progression of memory problems. She was the caretaker for her husband who recently died in 2013-11-07. Since that time, she has been living with her son. The patient is operating a motor vehicle, and she will have occasional episodes where she may get turnaround with directions. She has had some problems with accurately paying the bills over the last one year. The patient has required some assistance with keeping up with medications and appointments. She will repeat herself frequently, and she has issues with short-term memory. The patient denies any focal numbness or weakness of the face, arms, or legs. She denies issues controlling the bowels or the bladder. She denies any fatigue or problems with sleep. She has had some mild  issues with depression, and she was placed on Prozac, but she does not take this medication. The patient is sent to this office for further evaluation. She indicates that she has a history of vitamin B12 deficiency, but she does not take vitamin B12 supplementation consistently.    REVIEW OF SYSTEMS: Out of a complete 14 system review of symptoms, the patient complains only of the following symptoms, and all other reviewed systems are negative.  Memory loss, depression  ALLERGIES: Allergies  Allergen Reactions  . Anesthetics, Halogenated   . Lovastatin Other (See Comments)    MUSCLE ACHES    HOME MEDICATIONS: Outpatient Prescriptions Prior to Visit  Medication Sig Dispense Refill  . albuterol (PROVENTIL HFA;VENTOLIN HFA) 108 (90 BASE) MCG/ACT inhaler Inhale 2 puffs into the lungs 3 (three) times daily as needed for wheezing or shortness of breath.    . celecoxib (CELEBREX) 200 MG capsule Take 200 mg by mouth daily.    Marland Kitchen donepezil (ARICEPT) 10 MG tablet Take 10 mg by mouth daily.    Marland Kitchen losartan (COZAAR) 100 MG tablet Take 100 mg by mouth daily.    . mometasone (NASONEX) 50 MCG/ACT nasal spray Place 1-2 sprays into the nose daily.    . memantine (NAMENDA TITRATION PACK) tablet pack Please follow package instructions. (Patient not taking: Reported on 07/07/2014) 49 tablet 0   No facility-administered medications prior to visit.    PAST MEDICAL HISTORY: Past Medical History  Diagnosis Date  . Childhood asthma   . Colon polyps   .  Atypical chest pain   . GERD (gastroesophageal reflux disease)   . Left knee injury     DISLOCATION  . 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 Degenerative arthritis   . Vitamin B12 deficiency     PAST SURGICAL HISTORY: Past Surgical History  Procedure Laterality Date  . Volar ganglion cyst      INJECTION  . Vaginal hysterectomy  2003  . Carpal tunnel release Right 1995  . Dilation and curettage of uterus  1981    1990  . Tonsillectomy and adenoidectomy   1944  . Colonoscopy  2006    OCT    FAMILY HISTORY: Family History  Problem Relation Age of Onset  . Breast cancer Mother   . Parkinsonism Father   . Alzheimer's disease Father   . Dementia Father     SOCIAL HISTORY: History   Social History  . Marital Status: Married    Spouse Name: N/A    Number of Children: 2  . Years of Education: COLLEGE-1   Occupational History  . Retired   . CMA   . Lab [employer]    Social History Main Topics  . Smoking status: Never Smoker   . Smokeless tobacco: Never Used  . Alcohol Use: No  . Drug Use: No  . Sexual Activity: Not on file   Other Topics Concern  . Not on file   Social History Narrative      PHYSICAL EXAM  Filed Vitals:   07/07/14 1030  Height:  (1.499 m)  Weight: 98 lb (44.453 kg)   Body mass index is 19.78 kg/(m^2).  Generalized: Well developed, in no acute distress   Neurological examination  Mentation: Alert oriented to time, place, history taking. Follows all commands speech and language fluent. MMSE 28/30- missed Recall of words. Cranial nerve II-XII: Pupils were equal round reactive to light. Extraocular movements were full, visual field were full on confrontational test. Facial sensation and strength were normal. Uvula tongue midline. Head turning and shoulder shrug  were normal and symmetric. Motor: The motor testing reveals 5 over 5 strength of all 4 extremities. Good symmetric motor tone is noted throughout.  Sensory: Sensory testing is intact to soft touch on all 4 extremities. No evidence of extinction is noted.  Coordination: Cerebellar testing reveals good finger-nose-finger and heel-to-shin bilaterally.  Gait and station: Gait is normal. Tandem gait is normal. Romberg is negative. No drift is seen.  Reflexes: Deep tendon reflexes are symmetric and normal bilaterally.    DIAGNOSTIC DATA (LABS, IMAGING, TESTING) - I reviewed patient records, labs, notes, testing and imaging myself where  available.   Lab Results  Component Value Date   VITAMINB12 484 01/02/2014   Lab Results  Component Value Date   TSH 2.140 01/02/2014      ASSESSMENT AND PLAN 74 y.o. year old female  has a past medical history of Childhood asthma; Colon polyps; Atypical chest pain; GERD (gastroesophageal reflux disease); Left knee injury; Memory disorder (01/02/2014); Degenerative arthritis; and Vitamin B12 deficiency. here with:  1.Memory loss  Overall the patient's memory has remained stable. Her MMSE today is 28/30 was previously 25/30. The patient will continue Aricept, I will refill today. The patient was started on Remeron in September and states that she was recently put on Zoloft by Dr. Evlyn Kanner. She states that she only took 1 pill because the pharmacy told her she should not take these medications together. I have encouraged the patient to call Dr. Evlyn Kanner and make him aware  that she is on Remeron as well. She verbalized understanding. If her memory worsens or she develops new symptoms she should let us know. Otherwise she will follow up in 6 months or sooner if needed.     [name] PennyMegan Trevonte Ashkar, MSN, NP-C 07/07/2014, 10:32 AM [facility] [address], [address] [place], [place] [address] [phone]  Note: This document was prepared with digital dictation and possible smart phrase technology. Any transcriptional errors that result from this process are unintentional.

## 2014-07-07 NOTE — Patient Instructions (Signed)
Overall your memory has remained stable.  Continue Aricept, I will refill today. Call Dr. [place] about starting zoloft since you are already on remeron. If your symptoms worsen or you develop new symptoms please let us know.

## 2014-07-07 NOTE — Telephone Encounter (Signed)
Note created in error.

## 2014-07-07 NOTE — Progress Notes (Signed)
I have read the note, and I agree with the clinical assessment and plan.  Geoffry Bannister KEITH   

## 2014-10-29 ENCOUNTER — Other Ambulatory Visit (HOSPITAL_COMMUNITY): Payer: Self-pay | Admitting: Dermatology

## 2015-01-05 ENCOUNTER — Encounter: Payer: Self-pay | Admitting: Adult Health

## 2015-01-05 ENCOUNTER — Ambulatory Visit (INDEPENDENT_AMBULATORY_CARE_PROVIDER_SITE_OTHER): Payer: Medicare Other | Admitting: Adult Health

## 2015-01-05 VITALS — BP 128/83 | HR 64 | Ht <= 58 in | Wt 96.0 lb

## 2015-01-05 DIAGNOSIS — R413 Other amnesia: Secondary | ICD-10-CM

## 2015-01-05 NOTE — Patient Instructions (Signed)
Continue Aricept If your symptoms worsen or you develop new symptoms please let us know.   

## 2015-01-05 NOTE — Progress Notes (Signed)
PATIENT: Deborah Moreno DOB: 06/24/1941  REASON FOR VISIT: follow up- memory HISTORY FROM: patient  HISTORY OF PRESENT ILLNESS: Deborah Moreno is a 74 year old female with a history of memory disorder. She returns today for follow-up. She continues to take Aricept and is tolerating it well. She denies having to give up anything due to her memory. She is able to complete all ADLs independently. She operates a Librarian, academic without difficulty. She continues to complete her own finances with the supervision of her son. She is no longer on Remeron or Zoloft. She was switched to Wellbutrin. She feels that this is working well. Her son feels that she should consider seeing a psychiatrist just to have someone to talk to regarding her depression. He states that her depression occurred after the passing of her husband. She denies any new medical issues. She returns today for an evaluation.  HISTORY 07/07/14: Deborah Moreno is a 74 year old female with a history of memory disorder since June 2013. She returns today for follow-up. She is currently taking Aricept and tolerating it well. She was taken off Namenda by MD at [facility]. She was started on Remeron for depression. She lives with her son. She is able to complete all ADLs independently. She is able to operate a motor vehicle without difficulty. She states that sometimes she will question how to get to places. She states that she does most of the cooking. She does her own finances. Her son will oversee what she is doing. She has been on Remeron since September and feels that she isn't any worse. She states that Dr. Evlyn Kanner recently put her on Zoloft however the pharmacy told her not to take this medication with Remeron. No new issues since the last.   REVIEW OF SYSTEMS: Out of a complete 14 system review of symptoms, the patient complains only of the following symptoms, and all other reviewed systems are negative.  Joint pain, back pain, memory  loss   ALLERGIES: Allergies  Allergen Reactions  . Anesthetics, Halogenated   . Lovastatin Other (See Comments)    MUSCLE ACHES    HOME MEDICATIONS: Outpatient Prescriptions Prior to Visit  Medication Sig Dispense Refill  . albuterol (PROVENTIL HFA;VENTOLIN HFA) 108 (90 BASE) MCG/ACT inhaler Inhale 2 puffs into the lungs 3 (three) times daily as needed for wheezing or shortness of breath.    . celecoxib (CELEBREX) 200 MG capsule Take 200 mg by mouth daily.    Marland Kitchen donepezil (ARICEPT) 10 MG tablet Take 1 tablet (10 mg total) by mouth at bedtime. 30 tablet 3  . mometasone (NASONEX) 50 MCG/ACT nasal spray Place 1-2 sprays into the nose daily.    Marland Kitchen losartan (COZAAR) 100 MG tablet Take 100 mg by mouth daily.    . mirtazapine (REMERON) 15 MG tablet Take 15 mg by mouth.     No facility-administered medications prior to visit.    PAST MEDICAL HISTORY: Past Medical History  Diagnosis Date  . Childhood asthma   . Colon polyps   . Atypical chest pain   . GERD (gastroesophageal reflux disease)   . Left knee injury     DISLOCATION  . 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 Degenerative arthritis   . Vitamin B12 deficiency     PAST SURGICAL HISTORY: Past Surgical History  Procedure Laterality Date  . Volar ganglion cyst      INJECTION  . Vaginal hysterectomy  2003  . Carpal tunnel release Right 1995  . Dilation and curettage of  uterus  1981    1990  . Tonsillectomy and adenoidectomy  1944  . Colonoscopy  2006    OCT    FAMILY HISTORY: Family History  Problem Relation Age of Onset  . Breast cancer Mother   . Parkinsonism Father   . Alzheimer's disease Father   . Dementia Father     SOCIAL HISTORY: History   Social History  . Marital Status: Married    Spouse Name: N/A  . Number of Children: 2  . Years of Education: COLLEGE-1   Occupational History  . Retired   . CMA   . Lab [employer]    Social History Main Topics  . Smoking status: Never Smoker   . Smokeless tobacco: Never  Used  . Alcohol Use: No  . Drug Use: No  . Sexual Activity: Not on file   Other Topics Concern  . Not on file   Social History Narrative      PHYSICAL EXAM  Filed Vitals:   01/05/15 1005  Height: 4\' 10"  (1.473 m)  Weight: 96 lb (43.545 kg)   Body mass index is 20.07 kg/(m^2).  Generalized: Well developed, in no acute distress   Neurological examination  Mentation: Alert. Follows all commands speech and language fluent. MMSE 28/30 Cranial nerve II-XII: Pupils were equal round reactive to light. Extraocular movements were full, visual field were full on confrontational test. Facial sensation and strength were normal. Uvula tongue midline. Head turning and shoulder shrug  were normal and symmetric. Motor: The motor testing reveals 5 over 5 strength of all 4 extremities. Good symmetric motor tone is noted throughout.  Sensory: Sensory testing is intact to soft touch on all 4 extremities. No evidence of extinction is noted.  Coordination: Cerebellar testing reveals good finger-nose-finger and heel-to-shin bilaterally.  Gait and station: Gait is normal. Tandem gait is normal. Romberg is negative. No drift is seen.  Reflexes: Deep tendon reflexes are symmetric and normal bilaterally.   DIAGNOSTIC DATA (LABS, IMAGING, TESTING) - I reviewed patient records, labs, notes, testing and imaging myself where available.   ASSESSMENT AND PLAN 74 y.o. year old female  has a past medical history of Childhood asthma; Colon polyps; Atypical chest pain; GERD (gastroesophageal reflux disease); Left knee injury; Memory disorder (01/02/2014); Degenerative arthritis; and Vitamin B12 deficiency. here with:  1. Memory disorder  Overall the patient has remained stable. Her MMSE today is 28/30 was previously 28/30. She will continue on Aricept. Patient advised that if her symptoms worsen or she develops new symptoms she should let us know. Otherwise she will follow-up in 6 months or sooner if  needed.   [name] PennyMegan Sriram Febles, MSN, NP-C 01/05/2015, 10:03 AM [facility]  [address], [address] [place], [place] [address] [phone]  Note: This document was prepared with digital dictation and possible smart phrase technology. Any transcriptional errors that result from this process are unintentional.

## 2015-01-05 NOTE — Progress Notes (Signed)
I have read the note, and I agree with the clinical assessment and plan.  Kaelan Emami KEITH   

## 2015-01-27 ENCOUNTER — Telehealth: Payer: Self-pay

## 2015-01-27 ENCOUNTER — Telehealth: Payer: Self-pay | Admitting: Neurology

## 2015-01-27 NOTE — Telephone Encounter (Signed)
I spoke to Deborah Moreno on January 27, 2015 about the CREAD study.  I scheduled an appointment for her to come in on February 04, 2015 at 9:30am.  This will be for her FCSRT examination.

## 2015-01-27 NOTE — Telephone Encounter (Signed)
I left a message for the patient to return my call.

## 2015-01-27 NOTE — Telephone Encounter (Signed)
I spoke to the patient in re the CREAD study. The patient is interested in participating. I told the patient that I will call her back to schedule an FCSRT visit.

## 2015-02-04 ENCOUNTER — Encounter (INDEPENDENT_AMBULATORY_CARE_PROVIDER_SITE_OTHER): Payer: Self-pay

## 2015-02-04 ENCOUNTER — Telehealth: Payer: Self-pay

## 2015-02-04 DIAGNOSIS — Z0289 Encounter for other administrative examinations: Secondary | ICD-10-CM

## 2015-02-04 NOTE — Telephone Encounter (Signed)
Subject was here for FCSRT  For CREAD study screening. Subject was screen failed.

## 2015-07-08 ENCOUNTER — Ambulatory Visit (INDEPENDENT_AMBULATORY_CARE_PROVIDER_SITE_OTHER): Payer: Medicare Other | Admitting: Adult Health

## 2015-07-08 ENCOUNTER — Telehealth: Payer: Self-pay | Admitting: Adult Health

## 2015-07-08 ENCOUNTER — Encounter: Payer: Self-pay | Admitting: Adult Health

## 2015-07-08 VITALS — BP 140/88 | HR 88 | Ht <= 58 in | Wt 94.5 lb

## 2015-07-08 DIAGNOSIS — R413 Other amnesia: Secondary | ICD-10-CM

## 2015-07-08 NOTE — Telephone Encounter (Signed)
[facility] she was to call back with correct dosage of buPROPion (WELLBUTRIN XL) 150 MG 24 hr tablet. Patient states correct dosage is 150 mg.

## 2015-07-08 NOTE — Progress Notes (Signed)
PATIENT: Deborah RoysLynda Moreno DOB: 12-22-1940  REASON FOR VISIT: follow up- memory HISTORY FROM: patient  HISTORY OF PRESENT ILLNESS: [name] is a 75 year old female with a history of memory disorder. She returns today for follow-up. She continues to take Aricept and tolerating it well. She feels that her memory has remained the same. She continues to live at home with her son. She is able to complete all ADLs independently. She operates a Librarian, academicmotor vehicle without difficulty. She states that she typically only drives to familiar places. Patient states that occasionally she will forget to take her medication. She states that she does have a pillbox that she uses. She denies any new neurological symptoms. She continues to use Wellbutrin for her depression. Her son states that he called to have her set up with a psychiatrist to have her depression evaluated but was unable to get a call back. Patient returns today for an evaluation.  HISTORY 01/05/15: [name] is a 75 year old female with a history of memory disorder. She returns today for follow-up. She continues to take Aricept and is tolerating it well. She denies having to give up anything due to her memory. She is able to complete all ADLs independently. She operates a Librarian, academicmotor vehicle without difficulty. She continues to complete her own finances with the supervision of her son. She is no longer on Remeron or Zoloft. She was switched to Wellbutrin. She feels that this is working well. Her son feels that she should consider seeing a psychiatrist just to have someone to talk to regarding her depression. He states that her depression occurred after the passing of her husband. She denies any new medical issues. She returns today for an evaluation.  HISTORY 07/07/14: [name] is a 75 year old female with a history of memory disorder since June 2013. She returns today for follow-up. She is currently taking Aricept and tolerating it well. [facility] She was started on Remeron for depression. She lives with her son. She is able to complete all ADLs independently. She is able to operate a motor vehicle without difficulty. She states that sometimes she will question how to get to places. She states that she does most of the cooking. She does her own finances. Her son will oversee what she is doing. She has been on Remeron since September and feels that she isn't any worse. She states [facility] put her on Zoloft however the pharmacy told her not to take this medication with Remeron. No new issues since the last.    REVIEW OF SYSTEMS: Out of a complete 14 system review of symptoms, the patient complains only of the following symptoms, and all other reviewed systems are negative.  Memory loss, joint pain  ALLERGIES: Allergies  Allergen Reactions  . Anesthetics, Halogenated   . Lovastatin Other (See Comments)    MUSCLE ACHES    HOME MEDICATIONS: Outpatient Prescriptions Prior to Visit  Medication Sig Dispense Refill  . albuterol (PROVENTIL HFA;VENTOLIN HFA) 108 (90 BASE) MCG/ACT inhaler Inhale 2 puffs into the lungs 3 (three) times daily as needed for wheezing or shortness of breath.    . celecoxib (CELEBREX) 200 MG capsule Take 200 mg by mouth daily.    Marland Kitchen. donepezil (ARICEPT) 10 MG tablet Take 1 tablet (10 mg total) by mouth at bedtime. 30 tablet 3  . mometasone (NASONEX) 50 MCG/ACT nasal spray Place 1-2 sprays into the nose daily.    .Marland Kitchen  buPROPion (WELLBUTRIN) 75 MG tablet      No facility-administered medications prior to visit.    PAST MEDICAL HISTORY: Past Medical History  Diagnosis Date  . Childhood asthma   . Colon polyps   . Atypical chest pain   . GERD (gastroesophageal reflux disease)   . Left knee injury     DISLOCATION  . 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 Degenerative arthritis   . Vitamin B12 deficiency     PAST SURGICAL HISTORY: Past Surgical History  Procedure Laterality Date  .  Volar ganglion cyst      INJECTION  . Vaginal hysterectomy  2003  . Carpal tunnel release Right 1995  . Dilation and curettage of uterus  1981    1990  . Tonsillectomy and adenoidectomy  1944  . Colonoscopy  2006    OCT    FAMILY HISTORY: Family History  Problem Relation Age of Onset  . Breast cancer Mother   . Parkinsonism Father   . Alzheimer's disease Father   . Dementia Father     SOCIAL HISTORY: Social History   Social History  . Marital Status: Married    Spouse Name: N/A  . Number of Children: 2  . Years of Education: COLLEGE-1   Occupational History  . Retired   . CMA   . Lab [employer]    Social History Main Topics  . Smoking status: Never Smoker   . Smokeless tobacco: Never Used  . Alcohol Use: No  . Drug Use: No  . Sexual Activity: Not on file   Other Topics Concern  . Not on file   Social History Narrative   Patient lives at home her son lives with her.   Retired.   Education CMA.    Right handed.   Caffeine Yes.      PHYSICAL EXAM  Filed Vitals:   07/08/15 1015  BP: 140/88  Pulse: 88  Height: 4\' 10"  (1.473 m)  Weight: 94 lb 8 oz (42.865 kg)   Body mass index is 19.76 kg/(m^2).  MMSE - Mini Mental State Exam 07/08/2015 01/05/2015  Orientation to time 3 3  Orientation to Place 5 5  Registration 3 3  Attention/ Calculation 5 5  Recall 2 3  Language- name 2 objects 2 2  Language- repeat 1 1  Language- follow 3 step command 3 3  Language- read & follow direction 1 1  Write a sentence 1 1  Copy design 1 1  Total score 27 28     Generalized: Well developed, in no acute distress   Neurological examination  Mentation: Alert oriented to time, place, history taking. Follows all commands speech and language fluent Cranial nerve II-XII: Pupils were equal round reactive to light. Extraocular movements were full, visual field were full on confrontational test. Facial sensation and strength were normal. Uvula tongue midline. Head turning and  shoulder shrug  were normal and symmetric. Motor: The motor testing reveals 5 over 5 strength of all 4 extremities. Good symmetric motor tone is noted throughout.  Sensory: Sensory testing is intact to soft touch on all 4 extremities. No evidence of extinction is noted.  Coordination: Cerebellar testing reveals good finger-nose-finger and heel-to-shin bilaterally.  Gait and station: Gait is normal. Tandem gait is normal. Romberg is negative. No drift is seen.  Reflexes: Deep tendon reflexes are symmetric and normal bilaterally.   DIAGNOSTIC DATA (LABS, IMAGING, TESTING) - I reviewed patient records, labs, notes, testing and imaging myself where available.  ASSESSMENT AND PLAN 75 y.o. year old female  has a past medical history of Childhood asthma; Colon polyps; Atypical chest pain; GERD (gastroesophageal reflux disease); Left knee injury; Memory disorder (01/02/2014); Degenerative arthritis; and Vitamin B12 deficiency. here with:  1. Memory disturbance  The patient's memory has remained stable. Her MMSE today is 27/30 was previously 28/30. She will continue on Aricept. Patient advised that if her symptoms worsen or she develops any new symptoms she should let us know. She will follow-up in 6 months or sooner if needed.    [name] Penny, MSN, NP-C 07/08/2015, 10:48 AM [facility] [address], [address] [place], [place] [address] [phone]

## 2015-07-08 NOTE — Telephone Encounter (Signed)
Noted. Medication dose was correct in EPIC

## 2015-07-08 NOTE — Patient Instructions (Signed)
Continue Aricept Memory score stable If your symptoms worsen or you develop new symptoms please let us know.   

## 2015-07-08 NOTE — Progress Notes (Signed)
I have read the note, and I agree with the clinical assessment and plan.  WILLIS,CHARLES KEITH   

## 2016-01-04 ENCOUNTER — Telehealth: Payer: Self-pay | Admitting: *Deleted

## 2016-01-04 NOTE — Telephone Encounter (Signed)
Called pt, worked in tomorrow morning. Verbalized understanding and appreciation for call.

## 2016-01-05 ENCOUNTER — Ambulatory Visit (INDEPENDENT_AMBULATORY_CARE_PROVIDER_SITE_OTHER): Payer: Medicare Other | Admitting: Neurology

## 2016-01-05 ENCOUNTER — Encounter: Payer: Self-pay | Admitting: Neurology

## 2016-01-05 VITALS — BP 153/85 | HR 62 | Ht <= 58 in | Wt 98.2 lb

## 2016-01-05 DIAGNOSIS — R413 Other amnesia: Secondary | ICD-10-CM

## 2016-01-05 MED ORDER — DONEPEZIL HCL 10 MG PO TABS: 10.0000 mg | ORAL_TABLET | Freq: Every day | ORAL | Status: DC

## 2016-01-05 NOTE — Progress Notes (Signed)
Reason for visit: Memory disturbance  Deborah Moreno is an 75 y.o. female  History of present illness:  Deborah Moreno is a 75 year old right-handed white female with a history of mild memory disturbance. The patient has been followed through this office over the last 2 years, she has had very little change in her memory over that period of time. The patient is on Aricept taking 10 mg daily, she is tolerating the medication well. She returns to this office for an evaluation. She lives with her son, she operates motor vehicle without difficulty. She requires some assistance with the finances and keeping up with her medications. Overall, she has not altered her activities of daily living over the last 2 years because of any memory changes. She is able to maintain her weight on the Aricept. She returns for an evaluation. She indicates that she is sleeping well at night.  Past Medical History  Diagnosis Date  . Childhood asthma   . Colon polyps   . Atypical chest pain   . GERD (gastroesophageal reflux disease)   . Left knee injury     DISLOCATION  . 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 Degenerative arthritis   . Vitamin B12 deficiency     Past Surgical History  Procedure Laterality Date  . Volar ganglion cyst      INJECTION  . Vaginal hysterectomy  2003  . Carpal tunnel release Right 1995  . Dilation and curettage of uterus  1981    1990  . Tonsillectomy and adenoidectomy  1944  . Colonoscopy  2006    OCT    Family History  Problem Relation Age of Onset  . Breast cancer Mother   . Parkinsonism Father   . Alzheimer's disease Father   . Dementia Father     Social history:  reports that she has never smoked. She has never used smokeless tobacco. She reports that she does not drink alcohol or use illicit drugs.    Allergies  Allergen Reactions  . Anesthetics, Halogenated   . Lovastatin Other (See Comments)    MUSCLE ACHES    Medications:  Prior to Admission medications     Medication Sig Start Date End Date Taking? Authorizing Provider  albuterol (PROVENTIL HFA;VENTOLIN HFA) 108 (90 BASE) MCG/ACT inhaler Inhale 2 puffs into the lungs 3 (three) times daily as needed for wheezing or shortness of breath.    Historical Provider, MD  buPROPion (WELLBUTRIN XL) 150 MG 24 hr tablet  06/30/15   Historical Provider, MD  celecoxib (CELEBREX) 200 MG capsule Take 200 mg by mouth daily.    Historical Provider, MD  donepezil (ARICEPT) 10 MG tablet Take 1 tablet (10 mg total) by mouth at bedtime. 07/07/14   [name] PennyMegan Millikan, NP  mometasone (NASONEX) 50 MCG/ACT nasal spray Place 1-2 sprays into the nose daily.    Historical Provider, MD    ROS:  Out of a complete 14 system review of symptoms, the patient complains only of the following symptoms, and all other reviewed systems are negative.  Memory loss  Blood pressure 153/85, pulse 62, height 4\' 10"  (1.473 m), weight 98 lb 4 oz (44.566 kg).  Physical Exam  General: The patient is alert and cooperative at the time of the examination.  Skin: No significant peripheral edema is noted.   Neurologic Exam  Mental status: The patient is alert and oriented x 3 at the time of the examination. The patient has apparent normal recent and remote memory, with an apparently normal  attention span and concentration ability. Mini-Mental Status Examination done today shows a total score 29/30. The patient is able to name 19 animals in 30 seconds.   Cranial nerves: Facial symmetry is present. Speech is normal, no aphasia or dysarthria is noted. Extraocular movements are full. Visual fields are full.  Motor: The patient has good strength in all 4 extremities.  Sensory examination: Soft touch sensation is symmetric on the face, arms, and legs.  Coordination: The patient has good finger-nose-finger and heel-to-shin bilaterally.  Gait and station: The patient has a normal gait. Tandem gait is normal. Romberg is negative. No drift is  seen.  Reflexes: Deep tendon reflexes are symmetric.   Assessment/Plan:  1. Mild memory disturbance  The patient has been remarkably stable over the last 2 years. The patient will continue the Aricept for now, we will continue to monitor her cognitive issues. She will follow-up in 9 months. A prescription was called in for the Aricept.  Marlan Palau MD 01/05/2016 8:07 AM  [facility] [address] [place], [place] [address]  Phone [phone] Fax [phone]

## 2016-01-06 ENCOUNTER — Ambulatory Visit: Payer: Medicare Other | Admitting: Neurology

## 2016-01-13 ENCOUNTER — Ambulatory Visit: Payer: Self-pay | Admitting: Neurology

## 2016-07-15 ENCOUNTER — Inpatient Hospital Stay (HOSPITAL_COMMUNITY): Payer: Medicare Other

## 2016-07-15 ENCOUNTER — Encounter (HOSPITAL_COMMUNITY): Payer: Self-pay | Admitting: Emergency Medicine

## 2016-07-15 ENCOUNTER — Inpatient Hospital Stay (HOSPITAL_COMMUNITY)
Admission: EM | Admit: 2016-07-15 | Discharge: 2016-07-18 | DRG: 481 | Disposition: A | Discharge status: Skilled Nursing Facility | Payer: Medicare Other | Attending: Internal Medicine | Admitting: Internal Medicine

## 2016-07-15 ENCOUNTER — Emergency Department (HOSPITAL_COMMUNITY): Payer: Medicare Other

## 2016-07-15 DIAGNOSIS — Z79899 Other long term (current) drug therapy: Secondary | ICD-10-CM

## 2016-07-15 DIAGNOSIS — R413 Other amnesia: Secondary | ICD-10-CM | POA: Diagnosis present

## 2016-07-15 DIAGNOSIS — D62 Acute posthemorrhagic anemia: Secondary | ICD-10-CM | POA: Diagnosis not present

## 2016-07-15 DIAGNOSIS — Z820 Family history of epilepsy and other diseases of the nervous system: Secondary | ICD-10-CM

## 2016-07-15 DIAGNOSIS — Z803 Family history of malignant neoplasm of breast: Secondary | ICD-10-CM | POA: Diagnosis not present

## 2016-07-15 DIAGNOSIS — W1830XA Fall on same level, unspecified, initial encounter: Secondary | ICD-10-CM | POA: Diagnosis present

## 2016-07-15 DIAGNOSIS — F0390 Unspecified dementia without behavioral disturbance: Secondary | ICD-10-CM | POA: Diagnosis present

## 2016-07-15 DIAGNOSIS — Y92000 Kitchen of unspecified non-institutional (private) residence as  the place of occurrence of the external cause: Secondary | ICD-10-CM | POA: Diagnosis not present

## 2016-07-15 DIAGNOSIS — K219 Gastro-esophageal reflux disease without esophagitis: Secondary | ICD-10-CM | POA: Diagnosis present

## 2016-07-15 DIAGNOSIS — S72002A Fracture of unspecified part of neck of left femur, initial encounter for closed fracture: Secondary | ICD-10-CM | POA: Diagnosis present

## 2016-07-15 DIAGNOSIS — E538 Deficiency of other specified B group vitamins: Secondary | ICD-10-CM | POA: Diagnosis present

## 2016-07-15 DIAGNOSIS — F39 Unspecified mood [affective] disorder: Secondary | ICD-10-CM | POA: Diagnosis present

## 2016-07-15 DIAGNOSIS — S72012A Unspecified intracapsular fracture of left femur, initial encounter for closed fracture: Secondary | ICD-10-CM | POA: Diagnosis present

## 2016-07-15 DIAGNOSIS — Z419 Encounter for procedure for purposes other than remedying health state, unspecified: Secondary | ICD-10-CM

## 2016-07-15 DIAGNOSIS — J4520 Mild intermittent asthma, uncomplicated: Secondary | ICD-10-CM | POA: Diagnosis present

## 2016-07-15 DIAGNOSIS — M1990 Unspecified osteoarthritis, unspecified site: Secondary | ICD-10-CM | POA: Diagnosis present

## 2016-07-15 DIAGNOSIS — Z09 Encounter for follow-up examination after completed treatment for conditions other than malignant neoplasm: Secondary | ICD-10-CM

## 2016-07-15 LAB — CBC WITH DIFFERENTIAL/PLATELET
BASOS ABS: 0 10*3/uL (ref 0.0–0.1)
BASOS PCT: 0 %
Eosinophils Absolute: 0.1 10*3/uL (ref 0.0–0.7)
Eosinophils Relative: 1 %
HCT: 40 % (ref 36.0–46.0)
Hemoglobin: 13.2 g/dL (ref 12.0–15.0)
Lymphocytes Relative: 6 %
Lymphs Abs: 0.8 10*3/uL (ref 0.7–4.0)
MCH: 30.8 pg (ref 26.0–34.0)
MCHC: 33 g/dL (ref 30.0–36.0)
MCV: 93.5 fL (ref 78.0–100.0)
MONO ABS: 0.5 10*3/uL (ref 0.1–1.0)
Monocytes Relative: 4 %
NEUTROS ABS: 10.9 10*3/uL — AB (ref 1.7–7.7)
Neutrophils Relative %: 89 %
PLATELETS: 266 10*3/uL (ref 150–400)
RBC: 4.28 MIL/uL (ref 3.87–5.11)
RDW: 13.1 % (ref 11.5–15.5)
WBC: 12.3 10*3/uL — AB (ref 4.0–10.5)

## 2016-07-15 LAB — BASIC METABOLIC PANEL
ANION GAP: 12 (ref 5–15)
BUN: 18 mg/dL (ref 6–20)
CALCIUM: 9.2 mg/dL (ref 8.9–10.3)
CO2: 22 mmol/L (ref 22–32)
Chloride: 105 mmol/L (ref 101–111)
Creatinine, Ser: 0.76 mg/dL (ref 0.44–1.00)
Glucose, Bld: 106 mg/dL — ABNORMAL HIGH (ref 65–99)
POTASSIUM: 3.6 mmol/L (ref 3.5–5.1)
Sodium: 139 mmol/L (ref 135–145)

## 2016-07-15 LAB — TYPE AND SCREEN
ABO/RH(D): B POS
ANTIBODY SCREEN: NEGATIVE

## 2016-07-15 LAB — SURGICAL PCR SCREEN
MRSA, PCR: NEGATIVE
STAPHYLOCOCCUS AUREUS: POSITIVE — AB

## 2016-07-15 LAB — ABO/RH: ABO/RH(D): B POS

## 2016-07-15 MED ORDER — SODIUM CHLORIDE 0.9 % IV SOLN
INTRAVENOUS | Status: DC
  Administered 2016-07-15 – 2016-07-17 (×2): via INTRAVENOUS

## 2016-07-15 MED ORDER — BISACODYL 10 MG RE SUPP: 10.0000 mg | Freq: Every day | RECTAL | Status: DC | PRN

## 2016-07-15 MED ORDER — ENSURE ENLIVE PO LIQD
237.0000 mL | Freq: Two times a day (BID) | ORAL | Status: DC
  Administered 2016-07-15 – 2016-07-18 (×5): 237 mL via ORAL

## 2016-07-15 MED ORDER — POLYETHYLENE GLYCOL 3350 17 G PO PACK
17.0000 g | PACK | Freq: Every day | ORAL | Status: DC | PRN
  Filled 2016-07-15: qty 1

## 2016-07-15 MED ORDER — DONEPEZIL HCL 10 MG PO TABS
10.0000 mg | ORAL_TABLET | Freq: Every day | ORAL | Status: DC
  Administered 2016-07-15 – 2016-07-17 (×3): 10 mg via ORAL
  Filled 2016-07-15 (×3): qty 1

## 2016-07-15 MED ORDER — ALBUTEROL SULFATE (2.5 MG/3ML) 0.083% IN NEBU: 3.0000 mL | INHALATION_SOLUTION | Freq: Three times a day (TID) | RESPIRATORY_TRACT | Status: DC | PRN

## 2016-07-15 MED ORDER — ACETAMINOPHEN 500 MG PO TABS
1000.0000 mg | ORAL_TABLET | Freq: Once | ORAL | Status: AC
  Administered 2016-07-15: 1000 mg via ORAL
  Filled 2016-07-15: qty 2

## 2016-07-15 MED ORDER — MORPHINE SULFATE (PF) 4 MG/ML IV SOLN: 0.5000 mg | INTRAVENOUS | Status: DC | PRN

## 2016-07-15 MED ORDER — BUPROPION HCL ER (XL) 150 MG PO TB24
150.0000 mg | ORAL_TABLET | Freq: Every day | ORAL | Status: DC
  Administered 2016-07-15 – 2016-07-18 (×3): 150 mg via ORAL
  Filled 2016-07-15 (×3): qty 1

## 2016-07-15 MED ORDER — DOCUSATE SODIUM 100 MG PO CAPS
100.0000 mg | ORAL_CAPSULE | Freq: Two times a day (BID) | ORAL | Status: DC
  Administered 2016-07-15 – 2016-07-17 (×4): 100 mg via ORAL
  Filled 2016-07-15 (×4): qty 1

## 2016-07-15 MED ORDER — HYDROCODONE-ACETAMINOPHEN 5-325 MG PO TABS
1.0000 | ORAL_TABLET | Freq: Four times a day (QID) | ORAL | Status: DC | PRN
  Administered 2016-07-15: 2 via ORAL
  Administered 2016-07-16 – 2016-07-18 (×3): 1 via ORAL
  Filled 2016-07-15: qty 2
  Filled 2016-07-15 (×3): qty 1

## 2016-07-15 NOTE — ED Triage Notes (Signed)
[facility] reports the patient was standing at the sink when she fell. EMS reports no LOC. EMS reports the fall happened approximately 2 hours ago, 0209 this am. IV attempts unsuccessful. Vitals 132/68, P-90, R-18.   Pt states she was washing dishes when she lost her balance. Pt states she hit her head on the counter as she was falling. Pt reports pain in the left groin area.

## 2016-07-15 NOTE — ED Notes (Signed)
Pt states she was washing dishes when she lost her balance. Pt states she hit her head on the counter as she was falling. Pt reports pain in the left groin area.

## 2016-07-15 NOTE — Progress Notes (Signed)
Initial Nutrition Assessment  DOCUMENTATION CODES:   Not applicable  INTERVENTION:  Provide Ensure Enlive po BID, each supplement provides 350 kcal and 20 grams of protein.  Encourage adequate PO intake.   NUTRITION DIAGNOSIS:   Increased nutrient needs related to  (surgery) as evidenced by estimated needs.  GOAL:   Patient will meet greater than or equal to 90% of their needs  MONITOR:   PO intake, Supplement acceptance, Labs, Weight trends, Skin, I & O's  REASON FOR ASSESSMENT:   Consult Hip fracture protocol  ASSESSMENT:   76 y.o. female with a past medical history significant for dementia and intermittent asthma who presents with hip pain after a fall. Radiograph of the pelvis showed an impacted left femoral neck fracture.  Pt reports having a decreased appetite since admission. Meal completion 100% this AM. Pt reports consuming at least 2 meals a day PTA with snacks in between. Weight reported to be stable. Pt is agreeable on nutritional supplements to aid in healing. RD to order. Pt did report nausea during time of visit. Plans for surgery tomorrow.   Nutrition-Focused physical exam completed. Findings are no fat depletion, mild to moderate muscle depletion, and no edema.   Labs and medications reviewed.   Diet Order:  Diet NPO time specified Diet regular Room service appropriate? Yes; Fluid consistency: Thin  Skin:  Reviewed, no issues  Last BM:  1/18  Height:   Ht Readings from Last 1 Encounters:  07/15/16 4\' 10"  (1.473 m)    Weight:   Wt Readings from Last 1 Encounters:  07/15/16 100 lb (45.4 kg)    Ideal Body Weight:  43.76 kg  BMI:  Body mass index is 20.9 kg/m.  Estimated Nutritional Needs:   Kcal:  1350-1550  Protein:  55-65 grams  Fluid:  >/= 1.5 L/day  EDUCATION NEEDS:   Education needs addressed  Roslyn SmilingStephanie Peja Allender, MS, RD, LDN Pager # [phone] After hours/ weekend pager # [phone]

## 2016-07-15 NOTE — Progress Notes (Signed)
[facility] PLAN OF CARE NOTE Patient: Deborah Moreno ZOX:[id number]   PCP: Deborah FillersPATERSON,Deborah G, MD DOB: 05-05-1941   DOA: 07/15/2016   DOS: 07/15/2016    Patient was admitted by my colleague Dr. [name] earlier on 07/15/2016. I have reviewed the H&P as well as assessment and plan and agree with the same. Important changes in the plan are listed below.  Plan of care: Principal Problem:   Traumatic closed nondisplaced fracture of neck of left femur [facility](HCC) Active Problems:   Memory disorder   Intermittent asthma    Author: Lynden OxfordPranav Tinsley Ever[place], MD [facility] Pager: [phone] 07/15/2016 5:57 PM   If 7PM-7AM, please contact night-coverage at [email or web address], password [email or web address]

## 2016-07-15 NOTE — H&P (Signed)
History and Physical  Patient Name: Deborah Moreno     ZOX:[id number]    DOB: [birth year]    DOA: 07/15/2016 Referring provider: Tomasita Crumble, MD PCP: [name], MD  Outpatient specialists: Anne Hahn, Neurology   Patient coming from: Home     Chief Complaint: Hip pain and fall  HPI: Deborah Moreno is a 76 y.o. female with a past medical history significant for dementia and intermittent asthma who presents with hip pain after a fall.  The patient was in her normal state of health until this evening when she was at the sink, sometime after midnight, had a mechanical fall and had immediate LEFT hip pain and could not walk.  She did hit her head but has no headache or neck pain now.  There was no preceding dizziness, weakness, lightheadedness or vertigo, no chest discomfort, palpitations, or dyspnea, nor are there any of those symptoms now.  ED course: -Afebrile, heart rate 78, respirations and pulse ox normal, blood pressure 172/75 -Sodium 139, potassium 3.9, creatinine 0.76, WBC 12.3 K, hemoglobin 13.2 -Radiograph of the pelvis showed an impacted left femoral neck fracture -CT of the head and neck negative for fracture -The case was discussed with Dr. Shon Baton agreed to see the patient and [facility] was asked to admit for medical management       The patient denies active heart issues, angina, or history of MI. She can exert to an equivalent of 4 METS without dyspnea.  She denies history of TIAs/CVAs, CAD, CHF, or DM treated with insulin. She has no history of COPD or symptoms of OSA, including snoring, daytime drowsiness, apnea.  She has no history of prolonged steroid use and does not use insulin.  She does not use alcohol, and has no history of withdrawal symptoms or delirium in the context of previous surgeries.  She has ashtma but only uses a rescue inhaler sometimes.  Had hypertension in the past, off meds now.  Anesthesia Specific concerns: Presence of loose teeth: None Anesthesia  problems in past: "Very sensitive to it", but never fever, rigidity, difficulty awaking History of bleeding disorder: None  Review of Systems:  Review of Systems  Musculoskeletal: Positive for falls and joint pain.  All other systems reviewed and are negative.         Past Medical History:  Diagnosis Date  . Hypertension, now off meds   . Intermittent asthma asthma   . Colon polyps   . Degenerative arthritis   . GERD (gastroesophageal reflux disease)   . Left knee injury    DISLOCATION  . 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 Vitamin B12 deficiency     Past Surgical History:  Procedure Laterality Date  . CARPAL TUNNEL RELEASE Right 1995  . COLONOSCOPY  2006   OCT  . DILATION AND CURETTAGE OF UTERUS  1981   1990  . TONSILLECTOMY AND ADENOIDECTOMY  1944  . VAGINAL HYSTERECTOMY  2003  . VOLAR GANGLION CYST     INJECTION    Social History: Patient lives with her son.  Patient walks unassisted.  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  Remote minimal smoking history.    Allergies  Allergen Reactions  . Anesthetics, Halogenated Other (See Comments)    unknown  . Lovastatin Other (See Comments)    MUSCLE ACHES    Family history: family history includes Alzheimer's disease in her father; Breast cancer in her mother; Dementia in her father;  Parkinsonism in her father.  Prior to Admission medications   Medication Sig Start Date End Date Taking? Authorizing Provider  albuterol (PROVENTIL HFA;VENTOLIN HFA) 108 (90 BASE) MCG/ACT inhaler Inhale 2 puffs into the lungs 3 (three) times daily as needed for wheezing or shortness of breath.   Yes Historical Provider, MD  buPROPion (WELLBUTRIN XL) 150 MG 24 hr tablet Take 150 mg by mouth daily.  06/30/15  Yes Historical Provider, MD  celecoxib (CELEBREX) 200 MG capsule Take 200 mg by mouth 2 (two) times daily as needed for mild pain.    Yes Historical Provider, MD  Cyanocobalamin (VITAMIN  B-12 PO) Take 1 tablet by mouth daily.    Yes Historical Provider, MD  donepezil (ARICEPT) 10 MG tablet Take 1 tablet (10 mg total) by mouth at bedtime. 01/05/16  Yes York Spaniel, MD  Vitamin D, Ergocalciferol, (DRISDOL) 50000 units CAPS capsule Take 50,000 Units by mouth every 7 (seven) days.   Yes Historical Provider, MD       Physical Exam: BP 139/69 (BP Location: Left Arm)   Pulse 72   Temp 97.8 F (36.6 C)   Resp 18   Ht 4\' 10"  (1.473 m)   Wt 45.4 kg (100 lb)   SpO2 94%   BMI 20.90 kg/m  General appearance: Well-developed, thin adult female, alert and in no acute distress.   Eyes: Anicteric, conjunctiva pink, lids and lashes normal. PERRL.    ENT: No nasal deformity, discharge, epistaxis.  Hearing normal. OP moist without lesions.  Dentition good.    Lymph: No cervical, supraclavicular or axillary lymphadenopathy. Skin: Warm and dry.  No jaundice.  No suspicious rashes or lesions. Cardiac: RRR, nl S1-S2, no murmurs appreciated.  Capillary refill is brisk.  JVP normal.  No LE edema.  Radial and DP pulses 2+ and symmetric.  No carotid bruits. Respiratory: Normal respiratory rate and rhythm.  CTAB without rales or wheezes. GI: Abdomen soft without rigidity.  No TTP. No ascites, distension, no hepatosplenomegaly.  MSK: No leg foreshortening.  Actually she has her ankles crossed.  No effusions.  No clubbing or cyanosis. Neuro: Cranial nerves 3-12 intact.  Sensorium intact and responding to questions, attention normal.  Speech is fluent.  Moves all extremities equally and with normal coordination.  Strength 5/5 and symmetric in UEs, not tested in LEs.  Psych: The patient is oriented to time, place and person. Behavior appropriate.  Affect normal.  Recall, recent and remote, as well as general fund of knowledge seem within normal limits. No evidence of aural or visual hallucinations or delusions.     Labs on Admission:  I have personally reviewed following labs and imaging  studies: CBC:  Recent Labs Lab 07/15/16 0513  WBC 12.3*  NEUTROABS 10.9*  HGB 13.2  HCT 40.0  MCV 93.5  PLT 266   Basic Metabolic Panel:  Recent Labs Lab 07/15/16 0513  NA 139  K 3.6  CL 105  CO2 22  GLUCOSE 106*  BUN 18  CREATININE 0.76  CALCIUM 9.2   GFR: Estimated Creatinine Clearance: 38 mL/min (by C-G formula based on SCr of 0.76 mg/dL).  Liver Function Tests: No results for input(s): AST, ALT, ALKPHOS, BILITOT, PROT, ALBUMIN in the last 168 hours. No results for input(s): LIPASE, AMYLASE in the last 168 hours. No results for input(s): AMMONIA in the last 168 hours. Coagulation Profile: No results for input(s): INR, PROTIME in the last 168 hours. Cardiac Enzymes: No results for input(s): CKTOTAL, CKMB, CKMBINDEX, TROPONINI  in the last 168 hours. BNP (last 3 results) No results for input(s): PROBNP in the last 8760 hours. HbA1C: No results for input(s): HGBA1C in the last 72 hours. CBG: No results for input(s): GLUCAP in the last 168 hours. Lipid Profile: No results for input(s): CHOL, HDL, LDLCALC, TRIG, CHOLHDL, LDLDIRECT in the last 72 hours. Thyroid Function Tests: No results for input(s): TSH, T4TOTAL, FREET4, T3FREE, THYROIDAB in the last 72 hours. Anemia Panel: No results for input(s): VITAMINB12, FOLATE, FERRITIN, TIBC, IRON, RETICCTPCT in the last 72 hours.    Radiological Exams on Admission: Personally reviewed: Dg Hip Unilat W Or Wo Pelvis 2-3 Views Left  Result Date: 07/15/2016 CLINICAL DATA:  Post fall tonight with anterior thigh pain. Unable to weightbear. EXAM: DG HIP (WITH OR WITHOUT PELVIS) 2-3V LEFT COMPARISON:  None. FINDINGS: Impacted fracture of the left femoral neck. No significant displacement. Femoral head remains seated in the acetabulum. Pubic rami and remainder the bony pelvis is intact. IMPRESSION: Impaction fracture of the left femoral neck. Electronically Signed   By: Rubye OaksMelanie  Ehinger M.D.   On: 07/15/2016 04:53         Assessment and Plan: 1. Hip fracture: The patient will be [facility] to evaluate for operative fixation of the LEFT hip.   -Admit to med-surg bed -Hydrocodone-acetaminophen or morphine as tolerated for pain -Bed rest, apply ice, document sedation and vitals per Hip fracture protocol -NPO at midnight -MIVF -Nutrition consulted    Overall, the patient is at low risk for the planned surgery.  Patient has a RCRI score of 0 (active cardiac condition, CHF, CAD, DM treated with insulin, TIA/CVA, Cr > 2.0). The patient has no active cardiac symptoms, a functional capacity >4 METs, and would be expected to be at average risk for cardiac complications from this intermediate risk procedure. -No further testing needed  Pulmonary: Intermittent asthma.  May have albuterol PRN.   Endocrine: Patient has no history of steroid use or DM.  Heme: Transfusion threshold 7 mg/dL.  Post-operative medical care: Per AAOS 2014 guidelines on hip fractures in the elderly: -Recommend osteoporosis screening after discharge if not done previously -Recommend vitamin D 800 IUand calcium 1200 mg supplementation after discharge        2. Other medications: -Continue Wellbutrin -Continue Aricept Delirium precautions:   -Lights and TV off, minimize interruptions at night  -Blinds open and lights on during day  -Glasses/hearing aid with patient  -Frequent reorientation  -PT/OT when able  -Avoid sedation medications/Beers list medications            DVT prophylaxis: SCDs  Diet: NPO Code Status: FULL  Family Communication: Son at bedside  Disposition Plan: Anticipate evaluation by Orthopedics and surgical fixation tomorrow, then PT evaluation and discharge to SNF in 2-3 days. Admission status: INPATIENT for hip fracture, medical surgical bed     Medical decision making and consults: Patient seen at 6:13 AM on 07/15/2016.  The patient was discussed  with Dr. Mora Bellmanni. What exists of the patient's chart was reviewed in depth and summarized above.  Clinical condition: stable.      Alberteen SamChristopher P Webster Patrone [facility] Pager [phone]

## 2016-07-15 NOTE — ED Notes (Signed)
MD in with pt,  Delay in lab draw.

## 2016-07-15 NOTE — ED Provider Notes (Addendum)
[facility]-EMERGENCY DEPT Provider Note   CSN: [id number] Arrival date & time: 07/15/16  0407     History   Chief Complaint Chief Complaint  Patient presents with  . Fall    Possible hip fx    HPI Deborah Moreno is a 76 y.o. female no sig PMH, here with a fall.  Patient was in her kitchen and missed her step.  This was a mechanical fall and she denies any syncope of prodromal symptoms.  She has never fallen in the past.  She has pain in her L hip and has not been able to bear weight since.  No pain meds taken at home.  She did hit her hear but denies LOC.  No blood thinners.  There are no further complaints.     10 Systems reviewed and are negative for acute change except as noted in the HPI.   HPI  Past Medical History:  Diagnosis Date  . Atypical chest pain   . Childhood asthma   . Colon polyps   . Degenerative arthritis   . GERD (gastroesophageal reflux disease)   . Left knee injury    DISLOCATION  . 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 Vitamin B12 deficiency     Patient Active Problem List   Diagnosis Date Noted  . Memory disorder 01/02/2014    Past Surgical History:  Procedure Laterality Date  . CARPAL TUNNEL RELEASE Right 1995  . COLONOSCOPY  2006   OCT  . DILATION AND CURETTAGE OF UTERUS  1981   1990  . TONSILLECTOMY AND ADENOIDECTOMY  1944  . VAGINAL HYSTERECTOMY  2003  . VOLAR GANGLION CYST     INJECTION    OB History    No data available       Home Medications    Prior to Admission medications   Medication Sig Start Date End Date Taking? Authorizing Provider  albuterol (PROVENTIL HFA;VENTOLIN HFA) 108 (90 BASE) MCG/ACT inhaler Inhale 2 puffs into the lungs 3 (three) times daily as needed for wheezing or shortness of breath.    Historical Provider, MD  buPROPion (WELLBUTRIN XL) 150 MG 24 hr tablet  06/30/15   Historical Provider, MD  celecoxib (CELEBREX) 200 MG capsule Take 200 mg by mouth daily.    Historical Provider, MD  Cyanocobalamin (VITAMIN B-12 PO)  Take by mouth daily.    Historical Provider, MD  donepezil (ARICEPT) 10 MG tablet Take 1 tablet (10 mg total) by mouth at bedtime. 01/05/16   York Spanielharles K Willis, MD    Family History Family History  Problem Relation Age of Onset  . Breast cancer Mother   . Parkinsonism Father   . Alzheimer's disease Father   . Dementia Father     Social History Social History  Substance Use Topics  . Smoking status: Never Smoker  . Smokeless tobacco: Never Used  . Alcohol use No     Allergies   Anesthetics, halogenated and Lovastatin   Review of Systems Review of Systems   Physical Exam Updated Vital Signs BP 172/75 (BP Location: Left Arm)   Pulse 73   Temp 97.8 F (36.6 C)   Resp 18   Ht 4\' 10"  (1.473 m)   Wt 100 lb (45.4 kg)   SpO2 98%   BMI 20.90 kg/m   Physical Exam  Constitutional: She is oriented to person, place, and time. She appears well-developed and well-nourished. No distress.  HENT:  Head: Normocephalic and atraumatic.  Nose: Nose normal.  Mouth/Throat: Oropharynx is clear  and moist. No oropharyngeal exudate.  Eyes: Conjunctivae and EOM are normal. Pupils are equal, round, and reactive to light. No scleral icterus.  Neck: Normal range of motion. Neck supple. No JVD present. No tracheal deviation present. No thyromegaly present.  Cardiovascular: Normal rate, regular rhythm and normal heart sounds.  Exam reveals no gallop and no friction rub.   No murmur heard. Pulmonary/Chest: Effort normal and breath sounds normal. No respiratory distress. She has no wheezes. She exhibits no tenderness.  Abdominal: Soft. Bowel sounds are normal. She exhibits no distension and no mass. There is no tenderness. There is no rebound and no guarding.  Musculoskeletal: Normal range of motion. She exhibits tenderness. She exhibits no edema or deformity.  TTP of her proximal anterior femur.  Worsening pain with ROM.  Normal pulses and sensation distally.  Lymphadenopathy:    She has no  cervical adenopathy.  Neurological: She is alert and oriented to person, place, and time. No cranial nerve deficit. She exhibits normal muscle tone.  Normal pulses and sensation in all extremities. Normal finger to nose.  Skin: Skin is warm and dry. No rash noted. No erythema. No pallor.  Nursing note and vitals reviewed.    ED Treatments / Results  Labs (all labs ordered are listed, but only abnormal results are displayed) Labs Reviewed  CBC WITH DIFFERENTIAL/PLATELET  BASIC METABOLIC PANEL    EKG  EKG Interpretation None       Radiology Dg Hip Unilat W Or Wo Pelvis 2-3 Views Left  Result Date: 07/15/2016 CLINICAL DATA:  Post fall tonight with anterior thigh pain. Unable to weightbear. EXAM: DG HIP (WITH OR WITHOUT PELVIS) 2-3V LEFT COMPARISON:  None. FINDINGS: Impacted fracture of the left femoral neck. No significant displacement. Femoral head remains seated in the acetabulum. Pubic rami and remainder the bony pelvis is intact. IMPRESSION: Impaction fracture of the left femoral neck. Electronically Signed   By: Rubye Oaks M.D.   On: 07/15/2016 04:53    Procedures Procedures (including critical care time)  Medications Ordered in ED Medications  acetaminophen (TYLENOL) tablet 1,000 mg (1,000 mg Oral Given 07/15/16 0424)     Initial Impression / Assessment and Plan / ED Course  I have reviewed the triage vital signs and the nursing notes.  Pertinent labs & imaging results that were available during my care of the patient were reviewed by me and considered in my medical decision making (see chart for details).     Patient presents to the ED after a fall.  She can not bear weight on the L side, concerning for inury. She was given tylenol.  XR pending.     5:07 AM XR shows fem necj fracture.  Pain well controlled with tylenol.  Will consult with ortho.  Likely admit to hospitalist.  Labs pending.  Final Clinical Impressions(s) / ED Diagnoses   Final diagnoses:    Closed fracture of neck of left femur, initial encounter [facility])    New Prescriptions New Prescriptions   No medications on file     Tomasita Crumble, MD 07/15/16 0507  5:26 AM I spoke with Dr. Shon Baton with ortho who states to keep the patient NPO as they may operate on her today.  She has not eaten since before midnight per the patient. She did receive one dose of tylenol by myself in the ED.  Dr. Maryfrances Bunnell accepts the patient forr further care   Tomasita Crumble, MD 07/15/16 [phone]

## 2016-07-15 NOTE — Consult Note (Signed)
  Patient ID: Deborah Moreno MRN: [id number] DOB/AGE: 03/02/1941 76 y.o.  Admit date: 07/15/2016  Admission Diagnoses:  Principal Problem:   Traumatic closed nondisplaced fracture of neck of left femur (HCC) Active Problems:   Memory disorder   Intermittent asthma   HPI: Pleasant 76 year old female pt with a past med hx of dementia and asthma.  The pt is accompanied by her son who resides with her.  They reports the mom falling at 2:30 am this am in the kitchen.  Following the trama they were able to get her up but she was experiencing extreme pain in her left hip.  The ambulance was called and she was brought to the hospital.  Past Medical History: Past Medical History:  Diagnosis Date  . Atypical chest pain   . Childhood asthma   . Colon polyps   . Degenerative arthritis   . GERD (gastroesophageal reflux disease)   . Left knee injury    DISLOCATION  . 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 Vitamin B12 deficiency     Surgical History: Past Surgical History:  Procedure Laterality Date  . CARPAL TUNNEL RELEASE Right 1995  . COLONOSCOPY  2006   OCT  . DILATION AND CURETTAGE OF UTERUS  1981   1990  . TONSILLECTOMY AND ADENOIDECTOMY  1944  . VAGINAL HYSTERECTOMY  2003  . VOLAR GANGLION CYST     INJECTION    Family History: Family History  Problem Relation Age of Onset  . Breast cancer Mother   . Parkinsonism Father   . Alzheimer's disease Father   . Dementia Father     Social History: Social History   Social History  . Marital status: Married    Spouse name: N/A  . Number of children: 2  . Years of education: COLLEGE-1   Occupational History  . Retired   . CMA   . Lab Tech    Social History Main Topics  . Smoking status: Never Smoker  . Smokeless tobacco: Never Used  . Alcohol use No  . Drug use: No  . Sexual activity: Not on file   Other Topics Concern  . Not on file   Social History Narrative   Patient lives at home her son lives with her.   Retired.   Education CMA.    Right handed.   Caffeine Yes.    Allergies: Anesthetics, halogenated and Lovastatin  Medications: I have reviewed the patient's current medications.  Vital Signs: Patient Vitals for the past 24 hrs:  BP Temp Temp src Pulse Resp SpO2 Height Weight  07/15/16 0624 (!) 151/66 98 F (36.7 C) Oral 73 18 97 % - -  07/15/16 0545 139/69 - - 72 18 94 % - -  07/15/16 0530 119/89 - - 67 - 96 % - -  07/15/16 0414 - - - - - - 4' 10" (1.473 m) 45.4 kg (100 lb)  07/15/16 0413 172/75 97.8 F (36.6 C) - 73 18 98 % - -  07/15/16 0411 172/75 97.6 F (36.4 C) Oral 78 16 100 % - -    Radiology: Ct Hip Left Wo Contrast  Result Date: 07/15/2016 CLINICAL DATA:  Left hip pain secondary to a fall. EXAM: CT OF THE LEFT HIP WITHOUT CONTRAST TECHNIQUE: Multidetector CT imaging of the left the other visualized pelvic bones are intact. Only the more COMPARISON:  Radiographs dated 07/15/2016 FINDINGS: Bones/Joint/Cartilage There is a slightly impacted subcapital fracture of the left femoral neck. No displacement. Small joint effusion.   IMPRESSION: Slightly impacted subcapital fracture of the left femoral neck. Electronically Signed   By: James  Maxwell M.D.   On: 07/15/2016 08:19   Dg Hip Unilat W Or Wo Pelvis 2-3 Views Left  Result Date: 07/15/2016 CLINICAL DATA:  Post fall tonight with anterior thigh pain. Unable to weightbear. EXAM: DG HIP (WITH OR WITHOUT PELVIS) 2-3V LEFT COMPARISON:  None. FINDINGS: Impacted fracture of the left femoral neck. No significant displacement. Femoral head remains seated in the acetabulum. Pubic rami and remainder the bony pelvis is intact. IMPRESSION: Impaction fracture of the left femoral neck. Electronically Signed   By: Melanie  Ehinger M.D.   On: 07/15/2016 04:53    Labs:  Recent Labs  07/15/16 0513  WBC 12.3*  RBC 4.28  HCT 40.0  PLT 266    Recent Labs  07/15/16 0513  NA 139  K 3.6  CL 105  CO2 22  BUN 18  CREATININE 0.76    GLUCOSE 106*  CALCIUM 9.2   No results for input(s): LABPT, INR in the last 72 hours.  Review of Systems: ROS  Physical Exam: Neurologically intact ABD soft Sensation intact distally Dorsiflexion/Plantar flexion intact Compartment soft  TTP of left hip Increased pain in left hip with movement of the LLE  Assessment and Plan: Pt can eat today Pt will be NPO after midnight  Surgical intervention planned for tomorrow with Dr. Brandun Pinn  Dahari Brooks, MD [facility] [phone] 

## 2016-07-16 ENCOUNTER — Inpatient Hospital Stay (HOSPITAL_COMMUNITY): Payer: Medicare Other

## 2016-07-16 ENCOUNTER — Encounter (HOSPITAL_COMMUNITY): Payer: Self-pay | Admitting: Anesthesiology

## 2016-07-16 ENCOUNTER — Inpatient Hospital Stay (HOSPITAL_COMMUNITY): Payer: Medicare Other | Admitting: Anesthesiology

## 2016-07-16 ENCOUNTER — Encounter (HOSPITAL_COMMUNITY)
Admission: EM | Disposition: A | Discharge status: Skilled Nursing Facility | Payer: Self-pay | Source: Home / Self Care | Attending: Internal Medicine

## 2016-07-16 DIAGNOSIS — S72002A Fracture of unspecified part of neck of left femur, initial encounter for closed fracture: Secondary | ICD-10-CM | POA: Diagnosis present

## 2016-07-16 HISTORY — PX: HIP PINNING,CANNULATED: SHX1758

## 2016-07-16 LAB — CBC
HCT: 38.7 % (ref 36.0–46.0)
Hemoglobin: 12.7 g/dL (ref 12.0–15.0)
MCH: 30.5 pg (ref 26.0–34.0)
MCHC: 32.8 g/dL (ref 30.0–36.0)
MCV: 92.8 fL (ref 78.0–100.0)
Platelets: 259 10*3/uL (ref 150–400)
RBC: 4.17 MIL/uL (ref 3.87–5.11)
RDW: 13 % (ref 11.5–15.5)
WBC: 7 10*3/uL (ref 4.0–10.5)

## 2016-07-16 LAB — BASIC METABOLIC PANEL
Anion gap: 10 (ref 5–15)
BUN: 8 mg/dL (ref 6–20)
CALCIUM: 9 mg/dL (ref 8.9–10.3)
CO2: 23 mmol/L (ref 22–32)
CREATININE: 0.68 mg/dL (ref 0.44–1.00)
Chloride: 105 mmol/L (ref 101–111)
GFR calc Af Amer: >60 mL/min (ref >60)
GLUCOSE: 102 mg/dL — AB (ref 65–99)
Potassium: 3.4 mmol/L — ABNORMAL LOW (ref 3.5–5.1)
Sodium: 138 mmol/L (ref 135–145)

## 2016-07-16 SURGERY — FIXATION, FEMUR, NECK, PERCUTANEOUS, USING SCREW
Anesthesia: General | Site: Hip | Laterality: Left

## 2016-07-16 MED ORDER — ONDANSETRON HCL 4 MG/2ML IJ SOLN
INTRAMUSCULAR | Status: AC
  Administered 2016-07-16: 4 mg
  Filled 2016-07-16: qty 2

## 2016-07-16 MED ORDER — SUGAMMADEX SODIUM 200 MG/2ML IV SOLN
INTRAVENOUS | Status: AC
  Filled 2016-07-16: qty 2

## 2016-07-16 MED ORDER — METOCLOPRAMIDE HCL 5 MG PO TABS: 5.0000 mg | ORAL_TABLET | Freq: Three times a day (TID) | ORAL | Status: DC | PRN

## 2016-07-16 MED ORDER — ACETAMINOPHEN 650 MG RE SUPP: 650.0000 mg | Freq: Four times a day (QID) | RECTAL | Status: DC | PRN

## 2016-07-16 MED ORDER — CEFAZOLIN SODIUM-DEXTROSE 2-4 GM/100ML-% IV SOLN
2.0000 g | Freq: Four times a day (QID) | INTRAVENOUS | Status: AC
  Administered 2016-07-16: 2 g via INTRAVENOUS
  Filled 2016-07-16 (×2): qty 100

## 2016-07-16 MED ORDER — SUCCINYLCHOLINE CHLORIDE 200 MG/10ML IV SOSY
PREFILLED_SYRINGE | INTRAVENOUS | Status: AC
  Filled 2016-07-16: qty 10

## 2016-07-16 MED ORDER — METOCLOPRAMIDE HCL 5 MG/ML IJ SOLN: 5.0000 mg | Freq: Three times a day (TID) | INTRAMUSCULAR | Status: DC | PRN

## 2016-07-16 MED ORDER — ONDANSETRON HCL 4 MG/2ML IJ SOLN
INTRAMUSCULAR | Status: DC | PRN
  Administered 2016-07-16: 4 mg via INTRAVENOUS

## 2016-07-16 MED ORDER — PHENOL 1.4 % MT LIQD: 1.0000 | OROMUCOSAL | Status: DC | PRN

## 2016-07-16 MED ORDER — SUCCINYLCHOLINE CHLORIDE 200 MG/10ML IV SOSY
PREFILLED_SYRINGE | INTRAVENOUS | Status: DC | PRN
Start: 2016-07-16 — End: 2016-07-16
  Administered 2016-07-16: 80 mg via INTRAVENOUS

## 2016-07-16 MED ORDER — LACTATED RINGERS IV SOLN
INTRAVENOUS | Status: DC
  Administered 2016-07-16: 09:00:00 via INTRAVENOUS

## 2016-07-16 MED ORDER — ONDANSETRON HCL 4 MG/2ML IJ SOLN
INTRAMUSCULAR | Status: AC
  Filled 2016-07-16: qty 2

## 2016-07-16 MED ORDER — PROPOFOL 10 MG/ML IV BOLUS
INTRAVENOUS | Status: DC | PRN
  Administered 2016-07-16: 100 mg via INTRAVENOUS

## 2016-07-16 MED ORDER — ROCURONIUM BROMIDE 10 MG/ML (PF) SYRINGE
PREFILLED_SYRINGE | INTRAVENOUS | Status: DC | PRN
  Administered 2016-07-16 (×2): 10 mg via INTRAVENOUS

## 2016-07-16 MED ORDER — FENTANYL CITRATE (PF) 100 MCG/2ML IJ SOLN
INTRAMUSCULAR | Status: AC
  Filled 2016-07-16: qty 2

## 2016-07-16 MED ORDER — PROPOFOL 10 MG/ML IV BOLUS
INTRAVENOUS | Status: AC
  Filled 2016-07-16: qty 20

## 2016-07-16 MED ORDER — MORPHINE SULFATE (PF) 2 MG/ML IV SOLN: 0.5000 mg | INTRAVENOUS | Status: DC | PRN

## 2016-07-16 MED ORDER — ONDANSETRON HCL 4 MG PO TABS
4.0000 mg | ORAL_TABLET | Freq: Four times a day (QID) | ORAL | Status: DC | PRN
  Administered 2016-07-18 (×2): 4 mg via ORAL
  Filled 2016-07-16 (×2): qty 1

## 2016-07-16 MED ORDER — CHLORHEXIDINE GLUCONATE 4 % EX LIQD: 60.0000 mL | Freq: Once | CUTANEOUS | Status: DC

## 2016-07-16 MED ORDER — CEFAZOLIN SODIUM-DEXTROSE 2-4 GM/100ML-% IV SOLN
2.0000 g | INTRAVENOUS | Status: AC
  Administered 2016-07-16: 2 g via INTRAVENOUS

## 2016-07-16 MED ORDER — LIDOCAINE 2% (20 MG/ML) 5 ML SYRINGE
INTRAMUSCULAR | Status: AC
  Filled 2016-07-16: qty 5

## 2016-07-16 MED ORDER — MENTHOL 3 MG MT LOZG: 1.0000 | LOZENGE | OROMUCOSAL | Status: DC | PRN

## 2016-07-16 MED ORDER — ACETAMINOPHEN 325 MG PO TABS
650.0000 mg | ORAL_TABLET | Freq: Four times a day (QID) | ORAL | Status: DC | PRN
  Administered 2016-07-16 – 2016-07-17 (×3): 650 mg via ORAL
  Filled 2016-07-16 (×3): qty 2

## 2016-07-16 MED ORDER — SUGAMMADEX SODIUM 200 MG/2ML IV SOLN
INTRAVENOUS | Status: DC | PRN
  Administered 2016-07-16: 100 mg via INTRAVENOUS

## 2016-07-16 MED ORDER — ROCURONIUM BROMIDE 50 MG/5ML IV SOSY
PREFILLED_SYRINGE | INTRAVENOUS | Status: AC
  Filled 2016-07-16: qty 5

## 2016-07-16 MED ORDER — FENTANYL CITRATE (PF) 100 MCG/2ML IJ SOLN
INTRAMUSCULAR | Status: DC | PRN
  Administered 2016-07-16 (×2): 50 ug via INTRAVENOUS

## 2016-07-16 MED ORDER — 0.9 % SODIUM CHLORIDE (POUR BTL) OPTIME
TOPICAL | Status: DC | PRN
  Administered 2016-07-16: 1000 mL

## 2016-07-16 MED ORDER — LIDOCAINE 2% (20 MG/ML) 5 ML SYRINGE
INTRAMUSCULAR | Status: DC | PRN
  Administered 2016-07-16: 50 mg via INTRAVENOUS

## 2016-07-16 MED ORDER — FENTANYL CITRATE (PF) 100 MCG/2ML IJ SOLN
25.0000 ug | INTRAMUSCULAR | Status: DC | PRN
  Administered 2016-07-16 (×2): 50 ug via INTRAVENOUS

## 2016-07-16 MED ORDER — CEFAZOLIN SODIUM-DEXTROSE 2-4 GM/100ML-% IV SOLN
INTRAVENOUS | Status: AC
  Filled 2016-07-16: qty 100

## 2016-07-16 MED ORDER — ENOXAPARIN SODIUM 40 MG/0.4ML ~~LOC~~ SOLN
40.0000 mg | SUBCUTANEOUS | Status: DC
  Administered 2016-07-17 – 2016-07-18 (×2): 40 mg via SUBCUTANEOUS
  Filled 2016-07-16 (×2): qty 0.4

## 2016-07-16 MED ORDER — FENTANYL CITRATE (PF) 100 MCG/2ML IJ SOLN
INTRAMUSCULAR | Status: AC
  Administered 2016-07-16: 50 ug via INTRAVENOUS
  Filled 2016-07-16: qty 2

## 2016-07-16 MED ORDER — ONDANSETRON HCL 4 MG/2ML IJ SOLN
4.0000 mg | Freq: Four times a day (QID) | INTRAMUSCULAR | Status: DC | PRN
  Administered 2016-07-18: 4 mg via INTRAVENOUS
  Filled 2016-07-16: qty 2

## 2016-07-16 SURGICAL SUPPLY — 34 items
BIT DRILL 4.8X300 (BIT) ×2 IMPLANT
BIT DRILL 4.8X300MM (BIT) ×1
CHLORAPREP W/TINT 26ML (MISCELLANEOUS) ×3 IMPLANT
COVER PERINEAL POST (MISCELLANEOUS) ×3 IMPLANT
COVER SURGICAL LIGHT HANDLE (MISCELLANEOUS) ×3 IMPLANT
DERMABOND ADVANCED (GAUZE/BANDAGES/DRESSINGS) ×2
DERMABOND ADVANCED .7 DNX12 (GAUZE/BANDAGES/DRESSINGS) ×1 IMPLANT
DRAPE C-ARM 42X72 X-RAY (DRAPES) ×3 IMPLANT
DRAPE C-ARMOR (DRAPES) ×3 IMPLANT
DRAPE IMP U-DRAPE 54X76 (DRAPES) ×6 IMPLANT
DRAPE STERI IOBAN 125X83 (DRAPES) ×3 IMPLANT
DRAPE U-SHAPE 47X51 STRL (DRAPES) ×6 IMPLANT
DRSG AQUACEL AG ADV 3.5X 4 (GAUZE/BANDAGES/DRESSINGS) ×3 IMPLANT
DRSG AQUACEL AG ADV 3.5X 6 (GAUZE/BANDAGES/DRESSINGS) ×3 IMPLANT
ELECT REM PT RETURN 9FT ADLT (ELECTROSURGICAL) ×3
ELECTRODE REM PT RTRN 9FT ADLT (ELECTROSURGICAL) ×1 IMPLANT
GLOVE BIO SURGEON STRL SZ8.5 (GLOVE) ×3 IMPLANT
GLOVE BIOGEL PI IND STRL 8.5 (GLOVE) ×1 IMPLANT
GLOVE BIOGEL PI INDICATOR 8.5 (GLOVE) ×2
GOWN STRL REUS W/ TWL LRG LVL3 (GOWN DISPOSABLE) ×1 IMPLANT
GOWN STRL REUS W/TWL 2XL LVL3 (GOWN DISPOSABLE) ×3 IMPLANT
GOWN STRL REUS W/TWL LRG LVL3 (GOWN DISPOSABLE) ×2
KIT ROOM TURNOVER OR (KITS) ×3 IMPLANT
MARKER SKIN DUAL TIP RULER LAB (MISCELLANEOUS) ×3 IMPLANT
NS IRRIG 1000ML POUR BTL (IV SOLUTION) ×3 IMPLANT
PACK GENERAL/GYN (CUSTOM PROCEDURE TRAY) ×3 IMPLANT
PAD ARMBOARD 7.5X6 YLW CONV (MISCELLANEOUS) ×6 IMPLANT
PIN GUIDE DRILL TIP 2.8X300 (DRILL) ×9 IMPLANT
SCREW CANN 8.0X70 16 THRD (Screw) ×3 IMPLANT
SCREW CANNULATED 8.0X65 (Screw) ×3 IMPLANT
SCREW CANNULATED 8.0X70MM (Screw) ×9 IMPLANT
SUT MNCRL AB 3-0 PS2 27 (SUTURE) ×3 IMPLANT
SUT MON AB 2-0 CT1 27 (SUTURE) ×3 IMPLANT
TOWEL OR 17X26 10 PK STRL BLUE (TOWEL DISPOSABLE) ×3 IMPLANT

## 2016-07-16 NOTE — H&P (View-Only) (Signed)
Patient ID: Deborah RoysLynda Moreno MRN: [id number] DOB/AGE: 77-Jun-1942 77 y.o.  Admit date: 07/15/2016  Admission Diagnoses:  Principal Problem:   Traumatic closed nondisplaced fracture of neck of left femur (HCC) Active Problems:   Memory disorder   Intermittent asthma   HPI: Pleasant 76 year old female pt with a past med hx of dementia and asthma.  The pt is accompanied by her son who resides with her.  They reports the mom falling at 2:30 am this am in the kitchen.  Following the trama they were able to get her up but she was experiencing extreme pain in her left hip.  The ambulance was called and she was brought to the hospital.  Past Medical History: Past Medical History:  Diagnosis Date  . Atypical chest pain   . Childhood asthma   . Colon polyps   . Degenerative arthritis   . GERD (gastroesophageal reflux disease)   . Left knee injury    DISLOCATION  . 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 Vitamin B12 deficiency     Surgical History: Past Surgical History:  Procedure Laterality Date  . CARPAL TUNNEL RELEASE Right 1995  . COLONOSCOPY  2006   OCT  . DILATION AND CURETTAGE OF UTERUS  1981   1990  . TONSILLECTOMY AND ADENOIDECTOMY  1944  . VAGINAL HYSTERECTOMY  2003  . VOLAR GANGLION CYST     INJECTION    Family History: Family History  Problem Relation Age of Onset  . Breast cancer Mother   . Parkinsonism Father   . Alzheimer's disease Father   . Dementia Father     Social History: Social History   Social History  . Marital status: Married    Spouse name: N/A  . Number of children: 2  . Years of education: COLLEGE-1   Occupational History  . Retired   . CMA   . [employer]    Social History Main Topics  . Smoking status: Never Smoker  . Smokeless tobacco: Never Used  . Alcohol use No  . Drug use: No  . Sexual activity: Not on file   Other Topics Concern  . Not on file   Social History Narrative   Patient lives at home her son lives with her.   Retired.   Education CMA.    Right handed.   Caffeine Yes.    Allergies: Anesthetics, halogenated and Lovastatin  Medications: I have reviewed the patient's current medications.  Vital Signs: Patient Vitals for the past 24 hrs:  BP Temp Temp src Pulse Resp SpO2 Height Weight  07/15/16 0624 (!) 151/66 98 F (36.7 C) Oral 73 18 97 % - -  07/15/16 0545 139/69 - - 72 18 94 % - -  07/15/16 0530 119/89 - - 67 - 96 % - -  07/15/16 0414 - - - - - - 4\' 10"  (1.473 m) 45.4 kg (100 lb)  07/15/16 0413 172/75 97.8 F (36.6 C) - 73 18 98 % - -  07/15/16 0411 172/75 97.6 F (36.4 C) Oral 78 16 100 % - -    Radiology: Ct Hip Left Wo Contrast  Result Date: 07/15/2016 CLINICAL DATA:  Left hip pain secondary to a fall. EXAM: CT OF THE LEFT HIP WITHOUT CONTRAST TECHNIQUE: Multidetector CT imaging of the left the other visualized pelvic bones are intact. Only the more COMPARISON:  Radiographs dated 07/15/2016 FINDINGS: Bones/Joint/Cartilage There is a slightly impacted subcapital fracture of the left femoral neck. No displacement. Small joint effusion.  IMPRESSION: Slightly impacted subcapital fracture of the left femoral neck. Electronically Signed   By: Francene Boyers M.D.   On: 07/15/2016 08:19   Dg Hip Unilat W Or Wo Pelvis 2-3 Views Left  Result Date: 07/15/2016 CLINICAL DATA:  Post fall tonight with anterior thigh pain. Unable to weightbear. EXAM: DG HIP (WITH OR WITHOUT PELVIS) 2-3V LEFT COMPARISON:  None. FINDINGS: Impacted fracture of the left femoral neck. No significant displacement. Femoral head remains seated in the acetabulum. Pubic rami and remainder the bony pelvis is intact. IMPRESSION: Impaction fracture of the left femoral neck. Electronically Signed   By: Rubye Oaks M.D.   On: 07/15/2016 04:53    Labs:  Recent Labs  07/15/16 0513  WBC 12.3*  RBC 4.28  HCT 40.0  PLT 266    Recent Labs  07/15/16 0513  NA 139  K 3.6  CL 105  CO2 22  BUN 18  CREATININE 0.76    GLUCOSE 106*  CALCIUM 9.2   No results for input(s): LABPT, INR in the last 72 hours.  Review of Systems: ROS  Physical Exam: Neurologically intact ABD soft Sensation intact distally Dorsiflexion/Plantar flexion intact Compartment soft  TTP of left hip Increased pain in left hip with movement of the LLE  Assessment and Plan: Pt can eat today Pt will be NPO after midnight  Surgical intervention planned for tomorrow with Dr. Mellody Life, MD [facility] [phone]

## 2016-07-16 NOTE — Anesthesia Procedure Notes (Signed)
Procedure Name: Intubation Date/Time: 07/16/2016 9:01 AM Performed by: Marinda Elk A Pre-anesthesia Checklist: Patient identified, Emergency Drugs available, Suction available, Patient being monitored and Timeout performed Patient Re-evaluated:Patient Re-evaluated prior to inductionOxygen Delivery Method: Circle System Utilized and Circle system utilized Preoxygenation: Pre-oxygenation with 100% oxygen Intubation Type: IV induction Ventilation: Mask ventilation without difficulty Laryngoscope Size: Mac and 3 Grade View: Grade II Tube type: Oral Tube size: 7.0 mm Number of attempts: 1 Airway Equipment and Method: Stylet Placement Confirmation: ETT inserted through vocal cords under direct vision,  positive ETCO2 and breath sounds checked- equal and bilateral Secured at: 21 cm Tube secured with: Tape Dental Injury: Teeth and Oropharynx as per pre-operative assessment

## 2016-07-16 NOTE — Anesthesia Postprocedure Evaluation (Addendum)
Anesthesia Post Note  Patient: Deborah RoysLynda Moreno  Procedure(s) Performed: Procedure(s) (LRB): CANNULATED HIP PINNING (Left)  Patient location during evaluation: PACU Anesthesia Type: General Level of consciousness: awake and alert Pain management: pain level controlled Vital Signs Assessment: post-procedure vital signs reviewed and stable Respiratory status: spontaneous breathing, nonlabored ventilation, respiratory function stable and patient connected to nasal cannula oxygen Cardiovascular status: blood pressure returned to baseline and stable Postop Assessment: no signs of nausea or vomiting Anesthetic complications: no       Last Vitals:  Vitals:   07/16/16 1145 07/16/16 1417  BP: (!) 144/73 123/75  Pulse: 65 86  Resp: 16 16  Temp: 36.5 C 36.7 C    Last Pain:  Vitals:   07/16/16 1417  TempSrc: Oral  PainSc:                  Shelton SilvasKevin D Cassady Stanczak

## 2016-07-16 NOTE — Anesthesia Preprocedure Evaluation (Addendum)
Anesthesia Evaluation  Patient identified by MRN, date of birth, ID band Patient awake    Reviewed: Allergy & Precautions, NPO status , Patient's Chart, lab work & pertinent test results  Airway Mallampati: II  TM Distance: >3 FB Neck ROM: Full    Dental  (+) Teeth Intact, Caps, Dental Advisory Given   Pulmonary asthma ,    breath sounds clear to auscultation       Cardiovascular negative cardio ROS   Rhythm:Regular Rate:Normal     Neuro/Psych negative neurological ROS  negative psych ROS   GI/Hepatic Neg liver ROS, GERD  ,  Endo/Other  negative endocrine ROS  Renal/GU negative Renal ROS  negative genitourinary   Musculoskeletal  (+) Arthritis ,   Abdominal   Peds negative pediatric ROS (+)  Hematology negative hematology ROS (+)   Anesthesia Other Findings   Reproductive/Obstetrics negative OB ROS                           Anesthesia Physical Anesthesia Plan  ASA: II  Anesthesia Plan: General   Post-op Pain Management:    Induction: Intravenous  Airway Management Planned: Oral ETT  Additional Equipment:   Intra-op Plan:   Post-operative Plan: Extubation in OR  Informed Consent: I have reviewed the patients History and Physical, chart, labs and discussed the procedure including the risks, benefits and alternatives for the proposed anesthesia with the patient or authorized representative who has indicated his/her understanding and acceptance.   Dental advisory given  Plan Discussed with: CRNA  Anesthesia Plan Comments:         Anesthesia Quick Evaluation

## 2016-07-16 NOTE — Progress Notes (Signed)
[facility] Progress Note  Patient: Deborah RoysLynda Musquiz ZOX:[id number]   PCP: Garlan FillersPATERSON,DANIEL G, MD DOB: February 21, 1941   DOA: 07/15/2016   DOS: 07/16/2016   Date of Service: the patient was seen and examined on 07/16/2016  Brief hospital course: Pt. with PMH of Asthma, dementia; admitted on 07/15/2016, with complaint of fall mechanical, was found to have left hip fracture. S/P cannulated hip pinning on 07/16/2016 Currently further plan is to monitor her postoperative course.  Assessment and Plan: 1. Closed left fracture of neck femur. S/P cannulated hip pinning on 07/16/2016 Appreciate orthopedic input. PTOT consulted. Patient tolerated the procedure well. Recheck BMP and CBC tomorrow.  2. Intermittent asthma. Continue when necessary nebulizer. Monitor respiratory status post procedure.  3. Dementia. Mood disorder. Continue Wellbutrin continue Aricept.  Bowel regimen: last BM scheduled MiraLAX and Senokot Diet: Per surgery DVT Prophylaxis: subcutaneous Heparin  Advance goals of care discussion: Full code  Family Communication: no family was present at bedside, at the time of interview.   Disposition:  Discharge to home. Expected discharge date: 07/18/2016,  Consultants: Orthopedics Procedures: Cannulated left hip pinning  Antibiotics: Anti-infectives    Start     Dose/Rate Route Frequency Ordered Stop   07/16/16 0726  ceFAZolin (ANCEF) 2-4 GM/100ML-% IVPB    Comments:  Shireen Quanodd, Robert   : cabinet override      07/16/16 0726 07/16/16 0905   07/16/16 0725  ceFAZolin (ANCEF) IVPB 2g/100 mL premix     2 g 200 mL/hr over 30 Minutes Intravenous On call to O.R. 07/16/16 0725 07/16/16 0920        Subjective: Feeling better, has some nausea. Seen after surgery.  Objective: Physical Exam: Vitals:   07/16/16 1101 07/16/16 1115 07/16/16 1130 07/16/16 1145  BP: (!) 166/97 (!) 157/73 (!) 159/74 (!) 144/73  Pulse: 71 72 69 65  Resp: 13 16 16 16   Temp:    97.7 F (36.5 C)    TempSrc:      SpO2: 97% 94% 96% 97%  Weight:      Height:        Intake/Output Summary (Last 24 hours) at 07/16/16 1307 Last data filed at 07/16/16 1009  Gross per 24 hour  Intake              940 ml  Output              425 ml  Net              515 ml   Filed Weights   07/15/16 0414  Weight: 45.4 kg (100 lb)    General: Alert, Awake and Oriented to Time, Place and Person. Appear in mild distress, affect appropriate Eyes: PERRL, Conjunctiva normal ENT: Oral Mucosa clear moist. Neck: difficult to assess JVD, no Abnormal Mass Or lumps Cardiovascular: S1 and S2 Present, no Murmur, Respiratory: Bilateral Air entry equal and Decreased, no use of accessory muscle, Clear to Auscultation, no Crackles, no wheezes Abdomen: Bowel Sound present, Soft and no tenderness Skin: no redness, no Rash, no induration Extremities: no Pedal edema, no calf tenderness Neurologic: Grossly no focal neuro deficit. Bilaterally Equal motor strength  Data Reviewed: CBC:  Recent Labs Lab 07/15/16 0513 07/16/16 0409  WBC 12.3* 7.0  NEUTROABS 10.9*  --   HGB 13.2 12.7  HCT 40.0 38.7  MCV 93.5 92.8  PLT 266 259   Basic Metabolic Panel:  Recent Labs Lab 07/15/16 0513 07/16/16 0409  NA 139 138  K 3.6 3.4*  CL 105  105  CO2 22 23  GLUCOSE 106* 102*  BUN 18 8  CREATININE 0.76 0.68  CALCIUM 9.2 9.0    Liver Function Tests: No results for input(s): AST, ALT, ALKPHOS, BILITOT, PROT, ALBUMIN in the last 168 hours. No results for input(s): LIPASE, AMYLASE in the last 168 hours. No results for input(s): AMMONIA in the last 168 hours. Coagulation Profile: No results for input(s): INR, PROTIME in the last 168 hours. Cardiac Enzymes: No results for input(s): CKTOTAL, CKMB, CKMBINDEX, TROPONINI in the last 168 hours. BNP (last 3 results) No results for input(s): PROBNP in the last 8760 hours.  CBG: No results for input(s): GLUCAP in the last 168 hours.  Studies: Dg C-arm 1-60 Min  Result  Date: 07/16/2016 CLINICAL DATA:  ORIF left hip fracture EXAM: DG C-ARM 61-120 MIN; OPERATIVE LEFT HIP WITH PELVIS COMPARISON:  None. FLUOROSCOPY TIME:  1 minutes 14 seconds FINDINGS: Left femoral neck fracture transfixed with 2 cannulated femoral neck screws in near anatomic alignment. No hardware failure or complication. IMPRESSION: Interval ORIF of a left femoral neck fracture. Electronically Signed   By: Elige Ko   On: 07/16/2016 10:29   Dg Hip Port Unilat With Pelvis 1v Left  Result Date: 07/16/2016 CLINICAL DATA:  Status post left hip surgery EXAM: DG HIP (WITH OR WITHOUT PELVIS) 1V PORT LEFT COMPARISON:  None. FINDINGS: Left femoral neck fracture transfixed with 3 cannulated screws. No failure or complication. Normal alignment. IMPRESSION: Interval ORIF of a left femoral neck fracture. Electronically Signed   By: Elige Ko   On: 07/16/2016 10:58   Dg Hip Operative Unilat W Or W/o Pelvis Left  Result Date: 07/16/2016 CLINICAL DATA:  ORIF left hip fracture EXAM: DG C-ARM 61-120 MIN; OPERATIVE LEFT HIP WITH PELVIS COMPARISON:  None. FLUOROSCOPY TIME:  1 minutes 14 seconds FINDINGS: Left femoral neck fracture transfixed with 2 cannulated femoral neck screws in near anatomic alignment. No hardware failure or complication. IMPRESSION: Interval ORIF of a left femoral neck fracture. Electronically Signed   By: Elige Ko   On: 07/16/2016 10:29     Scheduled Meds: . buPROPion  150 mg Oral Daily  . docusate sodium  100 mg Oral BID  . donepezil  10 mg Oral QHS  . feeding supplement (ENSURE ENLIVE)  237 mL Oral BID BM   Continuous Infusions: . sodium chloride 100 mL/hr at 07/15/16 2105  . lactated ringers     PRN Meds: albuterol, bisacodyl, HYDROcodone-acetaminophen, morphine injection, polyethylene glycol  Time spent: 30 minutes  Author: Lynden Oxford, MD [facility] Pager: [phone] 07/16/2016 1:07 PM  If 7PM-7AM, please contact night-coverage at [email or web address], password  [facility]

## 2016-07-16 NOTE — Discharge Instructions (Signed)
 Dr. Jera Headings Adult Hip & Knee Specialist [facility] [address] [place], [place] [address] [phone]   POSTOPERATIVE DIRECTIONS    Hip Rehabilitation, Guidelines Following Surgery   WEIGHT BEARING Partial weight bearing with assist device as directed.  50%   HOME CARE INSTRUCTIONS  Remove items at home which could result in a fall. This includes throw rugs or furniture in walking pathways.  Continue medications as instructed at time of discharge.  You may have some home medications which will be placed on hold until you complete the course of blood thinner medication.  4 days after discharge, you may start showering. No tub baths or soaking your incisions. Do not put on socks or shoes without following the instructions of your caregivers.   Sit on chairs with arms. Use the chair arms to help push yourself up when arising.  Arrange for the use of a toilet seat elevator so you are not sitting low.   Walk with walker as instructed.  You may resume a sexual relationship in one month or when given the OK by your caregiver.  Use walker as long as suggested by your caregivers.  Avoid periods of inactivity such as sitting longer than an hour when not asleep. This helps prevent blood clots.  You may return to work once you are cleared by your surgeon.  Do not drive a car for 6 weeks or until released by your surgeon.  Do not drive while taking narcotics.  Wear elastic stockings for two weeks following surgery during the day but you may remove then at night.  Make sure you keep all of your appointments after your operation with all of your doctors and caregivers. You should call the office at the above phone number and make an appointment for approximately two weeks after the date of your surgery. Please pick up a stool softener and laxative for home use as long as you are requiring pain medications.  ICE to the affected hip every three hours for  30 minutes at a time and then as needed for pain and swelling. Continue to use ice on the hip for pain and swelling from surgery. You may notice swelling that will progress down to the foot and ankle.  This is normal after surgery.  Elevate the leg when you are not up walking on it.   It is important for you to complete the blood thinner medication as prescribed by your doctor.  Continue to use the breathing machine which will help keep your temperature down.  It is common for your temperature to cycle up and down following surgery, especially at night when you are not up moving around and exerting yourself.  The breathing machine keeps your lungs expanded and your temperature down.  RANGE OF MOTION AND STRENGTHENING EXERCISES  These exercises are designed to help you keep full movement of your hip joint. Follow your caregiver's or physical therapist's instructions. Perform all exercises about fifteen times, three times per day or as directed. Exercise both hips, even if you have had only one joint replacement. These exercises can be done on a training (exercise) mat, on the floor, on a table or on a bed. Use whatever works the best and is most comfortable for you. Use music or television while you are exercising so that the exercises are a pleasant break in your day. This will make your life better with the exercises acting as a break in routine you can look forward   to.  Lying on your back, slowly slide your foot toward your buttocks, raising your knee up off the floor. Then slowly slide your foot back down until your leg is straight again.  Lying on your back spread your legs as far apart as you can without causing discomfort.  Lying on your side, raise your upper leg and foot straight up from the floor as far as is comfortable. Slowly lower the leg and repeat.  Lying on your back, tighten up the muscle in the front of your thigh (quadriceps muscles). You can do this by keeping your leg straight and  trying to raise your heel off the floor. This helps strengthen the largest muscle supporting your knee.  Lying on your back, tighten up the muscles of your buttocks both with the legs straight and with the knee bent at a comfortable angle while keeping your heel on the floor.   SKILLED REHAB INSTRUCTIONS: If the patient is transferred to a skilled rehab facility following release from the hospital, a list of the current medications will be sent to the facility for the patient to continue.  When discharged from the skilled rehab facility, please have the facility set up the patient's Home Health Physical Therapy prior to being released. Also, the skilled facility will be responsible for providing the patient with their medications at time of release from the facility to include their pain medication and their blood thinner medication. If the patient is still at the rehab facility at time of the two week follow up appointment, the skilled rehab facility will also need to assist the patient in arranging follow up appointment in our office and any transportation needs.  MAKE SURE YOU:  Understand these instructions.  Will watch your condition.  Will get help right away if you are not doing well or get worse.  Pick up stool softner and laxative for home use following surgery while on pain medications. Daily dry dressing changes as needed. In 4 days, you may remove your dressings and begin taking showers - no tub baths or soaking the incisions. Continue to use ice for pain and swelling after surgery. Do not use any lotions or creams on the incision until instructed by your surgeon.   

## 2016-07-16 NOTE — Brief Op Note (Signed)
07/15/2016 - 07/16/2016  10:07 AM  PATIENT:  Deborah Moreno  76 y.o. female  PRE-OPERATIVE DIAGNOSIS:  Left Hip Fracture  POST-OPERATIVE DIAGNOSIS:  Left Hip Fracture  PROCEDURE:  Procedure(s): CANNULATED HIP PINNING (Left)  SURGEON:  Surgeon(s) and Role:    * Samson FredericBrian Ajahnae Rathgeber, MD - Primary  PHYSICIAN ASSISTANT: none  ASSISTANTS: staff   ANESTHESIA:   general  EBL:  Total I/O In: -  Out: 175 [Urine:100; Blood:75]  BLOOD ADMINISTERED:none  DRAINS: none   LOCAL MEDICATIONS USED:  NONE  SPECIMEN:  No Specimen  DISPOSITION OF SPECIMEN:  N/A  COUNTS:  YES  TOURNIQUET:  * No tourniquets in log *  DICTATION: .Other Dictation: Dictation Number [id number]  PLAN OF CARE: Admit to inpatient   PATIENT DISPOSITION:  PACU - hemodynamically stable.   Delay start of Pharmacological VTE agent (>24hrs) due to surgical blood loss or risk of bleeding: no

## 2016-07-16 NOTE — Interval H&P Note (Signed)
History and Physical Interval Note:  07/16/2016 8:01 AM  Deborah Moreno  has presented today for surgery, with the diagnosis of Left Hip Fracture  The various methods of treatment have been discussed with the patient and family. After consideration of risks, benefits and other options for treatment, the patient has consented to  Procedure(s): CANNULATED HIP PINNING (Left) as a surgical intervention .  The patient's history has been reviewed, patient examined, no change in status, stable for surgery.  I have reviewed the patient's chart and labs.  Questions were answered to the patient's satisfaction.    The risks, benefits, and alternatives were discussed with the patient. There are risks associated with the surgery including, but not limited to, problems with anesthesia (death), infection, differences in leg length/angulation/rotation, fracture of bones, loosening or failure of implants, malunion, nonunion, hematoma (blood accumulation) which may require surgical drainage, blood clots, pulmonary embolism, nerve injury (foot drop), and blood vessel injury. The patient understands these risks and elects to proceed.    Nykia Turko, Cloyde ReamsBrian James

## 2016-07-16 NOTE — Transfer of Care (Signed)
Immediate Anesthesia Transfer of Care Note  Patient: Deborah RoysLynda Moreno  Procedure(s) Performed: Procedure(s): CANNULATED HIP PINNING (Left)  Patient Location: PACU  Anesthesia Type:General  Level of Consciousness: awake  Airway & Oxygen Therapy: Patient Spontanous Breathing and Patient connected to nasal cannula oxygen  Post-op Assessment: Report given to RN and Post -op Vital signs reviewed and stable  Post vital signs: Reviewed and stable  Last Vitals:  Vitals:   07/15/16 2250 07/16/16 0457  BP: (!) 154/72 (!) 145/71  Pulse: 73 85  Resp:    Temp:  36.9 C    Last Pain:  Vitals:   07/16/16 0457  TempSrc: Oral  PainSc:       Patients Stated Pain Goal: 0 (07/15/16 1500)  Complications: No apparent anesthesia complications

## 2016-07-17 LAB — BASIC METABOLIC PANEL
Anion gap: 7 (ref 5–15)
BUN: 7 mg/dL (ref 6–20)
CHLORIDE: 108 mmol/L (ref 101–111)
CO2: 24 mmol/L (ref 22–32)
CREATININE: 0.78 mg/dL (ref 0.44–1.00)
Calcium: 8.1 mg/dL — ABNORMAL LOW (ref 8.9–10.3)
Glucose, Bld: 90 mg/dL (ref 65–99)
POTASSIUM: 3.5 mmol/L (ref 3.5–5.1)
SODIUM: 139 mmol/L (ref 135–145)

## 2016-07-17 LAB — CBC
HEMATOCRIT: 32.7 % — AB (ref 36.0–46.0)
Hemoglobin: 10.6 g/dL — ABNORMAL LOW (ref 12.0–15.0)
MCH: 30.1 pg (ref 26.0–34.0)
MCHC: 32.4 g/dL (ref 30.0–36.0)
MCV: 92.9 fL (ref 78.0–100.0)
PLATELETS: 220 10*3/uL (ref 150–400)
RBC: 3.52 MIL/uL — AB (ref 3.87–5.11)
RDW: 12.9 % (ref 11.5–15.5)
WBC: 5.2 10*3/uL (ref 4.0–10.5)

## 2016-07-17 MED ORDER — POLYETHYLENE GLYCOL 3350 17 G PO PACK
17.0000 g | PACK | Freq: Every day | ORAL | Status: DC
  Administered 2016-07-17 – 2016-07-18 (×2): 17 g via ORAL
  Filled 2016-07-17: qty 1

## 2016-07-17 MED ORDER — SENNOSIDES-DOCUSATE SODIUM 8.6-50 MG PO TABS
1.0000 | ORAL_TABLET | Freq: Two times a day (BID) | ORAL | Status: DC
  Administered 2016-07-17 – 2016-07-18 (×3): 1 via ORAL
  Filled 2016-07-17 (×2): qty 1

## 2016-07-17 MED ORDER — ACETAMINOPHEN-CODEINE #3 300-30 MG PO TABS
1.0000 | ORAL_TABLET | Freq: Four times a day (QID) | ORAL | Status: DC | PRN
Start: 2016-07-17 — End: 2016-07-18

## 2016-07-17 MED ORDER — METHOCARBAMOL 500 MG PO TABS
500.0000 mg | ORAL_TABLET | Freq: Three times a day (TID) | ORAL | Status: DC
  Administered 2016-07-17 – 2016-07-18 (×4): 500 mg via ORAL
  Filled 2016-07-17 (×4): qty 1

## 2016-07-17 NOTE — Progress Notes (Signed)
[facility] Progress Note  Patient: Deborah RoysLynda Moreno WUJ:[id number]   PCP: Garlan FillersPATERSON,DANIEL G, MD DOB: 11-23-1940   DOA: 07/15/2016   DOS: 07/17/2016   Date of Service: the patient was seen and examined on 07/17/2016  Brief hospital course: Pt. with PMH of Asthma, dementia; admitted on 07/15/2016, with complaint of fall mechanical, was found to have left hip fracture. S/P cannulated hip pinning on 07/16/2016 Currently further plan is to monitor her postoperative course.  Assessment and Plan: 1. Closed left fracture of neck femur. S/P cannulated hip pinning on 07/16/2016 Post-op blood loss anemia  Appreciate orthopedic input. PTOT consulted. Patient tolerated the procedure well. Post-op blood loss, monitor at present  2. Intermittent asthma. Continue when necessary nebulizer. Monitor respiratory status post procedure.  3. Dementia. Mood disorder. Continue Wellbutrin continue Aricept.  Bowel regimen: last BM 07/14/2016 Diet: cardiac diet DVT Prophylaxis: subcutaneous Heparin  Advance goals of care discussion: Full code  Family Communication: family was present at bedside, at the time of interview.   Disposition:  Discharge to home. Expected discharge date: 07/18/2016  Consultants: Orthopedics Procedures: Cannulated left hip pinning  Antibiotics: Anti-infectives    Start     Dose/Rate Route Frequency Ordered Stop   07/16/16 1500  ceFAZolin (ANCEF) IVPB 2g/100 mL premix     2 g 200 mL/hr over 30 Minutes Intravenous Every 6 hours 07/16/16 1313 07/17/16 0259   07/16/16 0726  ceFAZolin (ANCEF) 2-4 GM/100ML-% IVPB    Comments:  Shireen Quanodd, Robert   : cabinet override      07/16/16 0726 07/16/16 0905   07/16/16 0725  ceFAZolin (ANCEF) IVPB 2g/100 mL premix     2 g 200 mL/hr over 30 Minutes Intravenous On call to O.R. 07/16/16 0725 07/16/16 0920     Subjective: Feeling better, pain well controlled, no nausea  Objective: Physical Exam: Vitals:   07/16/16 1417 07/16/16 2033  07/17/16 0456 07/17/16 1300  BP: 123/75 (!) 116/56 (!) 118/54 (!) 130/104  Pulse: 86 71 79 77  Resp: 16     Temp: 98 F (36.7 C) 99.1 F (37.3 C) 98.4 F (36.9 C) 98.1 F (36.7 C)  TempSrc: Oral Oral Oral Oral  SpO2: 100% 90% 92% 95%  Weight:      Height:        Intake/Output Summary (Last 24 hours) at 07/17/16 1446 Last data filed at 07/17/16 1445  Gross per 24 hour  Intake              720 ml  Output             1300 ml  Net             -580 ml   Filed Weights   07/15/16 0414  Weight: 45.4 kg (100 lb)    General: Alert, Awake and Oriented to Time, Place and Person. Appear in mild distress, affect appropriate Eyes: PERRL, Conjunctiva normal ENT: Oral Mucosa clear moist. Neck: difficult to assess JVD, no Abnormal Mass Or lumps Cardiovascular: S1 and S2 Present, no Murmur, Respiratory: Bilateral Air entry equal and Decreased, no use of accessory muscle, Clear to Auscultation, no Crackles, no wheezes Abdomen: Bowel Sound present, Soft and no tenderness Skin: no redness, no Rash, no induration Extremities: no Pedal edema, no calf tenderness Neurologic: Grossly no focal neuro deficit. Bilaterally Equal motor strength  Data Reviewed: CBC:  Recent Labs Lab 07/15/16 0513 07/16/16 0409 07/17/16 0543  WBC 12.3* 7.0 5.2  NEUTROABS 10.9*  --   --  HGB 13.2 12.7 10.6*  HCT 40.0 38.7 32.7*  MCV 93.5 92.8 92.9  PLT 266 259 220   Basic Metabolic Panel:  Recent Labs Lab 07/15/16 0513 07/16/16 0409 07/17/16 0543  NA 139 138 139  K 3.6 3.4* 3.5  CL 105 105 108  CO2 22 23 24   GLUCOSE 106* 102* 90  BUN 18 8 7   CREATININE 0.76 0.68 0.78  CALCIUM 9.2 9.0 8.1*    Liver Function Tests: No results for input(s): AST, ALT, ALKPHOS, BILITOT, PROT, ALBUMIN in the last 168 hours. No results for input(s): LIPASE, AMYLASE in the last 168 hours. No results for input(s): AMMONIA in the last 168 hours. Coagulation Profile: No results for input(s): INR, PROTIME in the last 168  hours. Cardiac Enzymes: No results for input(s): CKTOTAL, CKMB, CKMBINDEX, TROPONINI in the last 168 hours. BNP (last 3 results) No results for input(s): PROBNP in the last 8760 hours.  CBG: No results for input(s): GLUCAP in the last 168 hours.  Studies: No results found.   Scheduled Meds: . buPROPion  150 mg Oral Daily  . donepezil  10 mg Oral QHS  . enoxaparin (LOVENOX) injection  40 mg Subcutaneous Q24H  . feeding supplement (ENSURE ENLIVE)  237 mL Oral BID BM  . methocarbamol  500 mg Oral TID  . polyethylene glycol  17 g Oral Daily  . senna-docusate  1 tablet Oral BID   Continuous Infusions: . sodium chloride 100 mL/hr at 07/17/16 0440   PRN Meds: acetaminophen **OR** acetaminophen, acetaminophen-codeine, albuterol, bisacodyl, HYDROcodone-acetaminophen, menthol-cetylpyridinium **OR** phenol, metoCLOPramide **OR** metoCLOPramide (REGLAN) injection, morphine injection, ondansetron **OR** ondansetron (ZOFRAN) IV  Time spent: 30 minutes  Author: Lynden Oxford, MD [facility] Pager: [phone] 07/17/2016 2:46 PM  If 7PM-7AM, please contact night-coverage at [email or web address], password [facility]

## 2016-07-17 NOTE — Progress Notes (Signed)
Subjective: 1 Day Post-Op Procedure(s) (LRB): CANNULATED HIP PINNING (Left) Patient reports pain as mild.    Objective: Vital signs in last 24 hours: Temp:  [97.5 F (36.4 C)-99.1 F (37.3 C)] 98.4 F (36.9 C) (01/21 0456) Pulse Rate:  [65-93] 79 (01/21 0456) Resp:  [11-22] 16 (01/20 1417) BP: (116-186)/(54-163) 118/54 (01/21 0456) SpO2:  [90 %-100 %] 92 % (01/21 0456)  Intake/Output from previous day: 01/20 0701 - 01/21 0700 In: 1420 [P.O.:720; I.V.:700] Out: 675 [Urine:600; Blood:75] Intake/Output this shift: No intake/output data recorded.   Recent Labs  07/15/16 0513 07/16/16 0409 07/17/16 0543  HGB 13.2 12.7 10.6*    Recent Labs  07/16/16 0409 07/17/16 0543  WBC 7.0 5.2  RBC 4.17 3.52*  HCT 38.7 32.7*  PLT 259 220    Recent Labs  07/16/16 0409 07/17/16 0543  NA 138 139  K 3.4* 3.5  CL 105 108  CO2 23 24  BUN 8 7  CREATININE 0.68 0.78  GLUCOSE 102* 90  CALCIUM 9.0 8.1*   No results for input(s): LABPT, INR in the last 72 hours.    Assessment/Plan: 1 Day Post-Op Procedure(s) (LRB): CANNULATED HIP PINNING (Left) Neurologically intact Neurovascular intact Compartment soft Dressing C/D/I  Begin PT today  Anaid Haney V 07/17/2016, 8:47 AM

## 2016-07-17 NOTE — Evaluation (Signed)
Occupational Therapy Evaluation Patient Details Name: Deborah Moreno MRN: [id number] DOB: 1940-09-07 Today's Date: 07/17/2016    History of Present Illness Deborah Moreno is a 76 y.o. female with a past medical history significant for dementia and intermittent asthma who presents with hip pain after a fall, resulting in L hip fx; now s/p Hip pinning, 50%PWB   Clinical Impression   PTA, pt was independent with basic ADL and required assistance for more complex IADL from her son although she was able to make simple meals. Pt currently requires mod assist for toilet transfer with +2 assistance for safety. Additionally she requires max assist for LB ADL at this time. She has a history of dementia and does demonstrate decreased short-term memory and requires VC's but is able to problem solve simple ADL tasks. Pt lives with her son who is able to provide supervision/assistance during the mornings but works in the afternoons and evenings. Pt would benefit from continued rehabilitation services post-acute D/C in order to improve independence with ADL and functional mobility. Recommend in-home assistance after return home to maximize safety. OT will continue to follow while admitted to address ADL safety and independence.     Follow Up Recommendations  Supervision/Assistance - 24 hour;SNF    Equipment Recommendations  Other (comment) (TBD at next venue of care)    Recommendations for Other Services       Precautions / Restrictions Precautions Precautions: Fall Restrictions Weight Bearing Restrictions: Yes LLE Weight Bearing: Partial weight bearing LLE Partial Weight Bearing Percentage or Pounds: 50      Mobility Bed Mobility Overal bed mobility: Needs Assistance Bed Mobility: Supine to Sit     Supine to sit: +2 for safety/equipment;Mod assist     General bed mobility comments: VC's for technique. Mod assist to square hips and raise trunk.  Transfers Overall transfer level: Needs  assistance Equipment used: Rolling walker (2 wheeled) Transfers: Sit to/from Stand Sit to Stand: Min assist;+2 safety/equipment         General transfer comment: Cues for hand placement and to maintain PWB status.    Balance Overall balance assessment: Needs assistance Sitting-balance support: Feet supported;Bilateral upper extremity supported Sitting balance-Leahy Scale: Fair     Standing balance support: Bilateral upper extremity supported;During functional activity Standing balance-Leahy Scale: Poor                              ADL Overall ADL's : Needs assistance/impaired Eating/Feeding: Set up;Bed level   Grooming: Set up;Sitting;Oral care;Supervision/safety   Upper Body Bathing: Minimal assistance;Sitting   Lower Body Bathing: Maximal assistance;Sit to/from stand   Upper Body Dressing : Minimal assistance;Sitting   Lower Body Dressing: Maximal assistance;Sit to/from stand   Toilet Transfer: Minimal assistance;Ambulation;+2 for safety/equipment;BSC;RW   Toileting- Clothing Manipulation and Hygiene: Moderate assistance;Sit to/from stand       Functional mobility during ADLs: Moderate assistance;+2 for safety/equipment       Vision Vision Assessment?: No apparent visual deficits   Perception     Praxis      Pertinent Vitals/Pain Pain Assessment: Faces Faces Pain Scale: Hurts even more Pain Location: L hip Pain Descriptors / Indicators: Aching;Discomfort;Grimacing;Operative site guarding;Sore Pain Intervention(s): Monitored during session     Hand Dominance Right   Extremity/Trunk Assessment Upper Extremity Assessment Upper Extremity Assessment: Defer to OT evaluation   Lower Extremity Assessment Lower Extremity Assessment: LLE deficits/detail LLE Deficits / Details: Grosssly decr AROM and strength L hip, limited by  pain LLE: Unable to fully assess due to pain       Communication Communication Communication: No difficulties    Cognition Arousal/Alertness: Awake/alert Behavior During Therapy: WFL for tasks assessed/performed Overall Cognitive Status: History of cognitive impairments - at baseline                 General Comments: Pt with slightly decreased short-term memory for previous events during session. Pt able to problem solve with minimal verbal cues and has good awareness of safety. She does demonstrate emergent awareness. She is oriented x4.   General Comments       Exercises       Shoulder Instructions      Home Living Family/patient expects to be discharged to:: Skilled nursing facility Living Arrangements: Children (Son) Available Help at Discharge: Available PRN/intermittently;Family (Son works in Economist and gone approx. 1pm-12am) Type of Home: House       Home Layout: One level     Bathroom Shower/Tub: Producer, television/film/video: Standard     Home Equipment: Environmental consultant - 2 wheels          Prior Functioning/Environment Level of Independence: Independent        Comments: Pt with history of dementia but independent with simple home and self care tasks.        OT Problem List: Decreased strength;Decreased range of motion;Decreased activity tolerance;Impaired balance (sitting and/or standing);Decreased safety awareness;Decreased knowledge of use of DME or AE;Decreased knowledge of precautions;Pain   OT Treatment/Interventions: Self-care/ADL training;Therapeutic exercise;Therapeutic activities;Patient/family education;Balance training;Energy conservation;DME and/or AE instruction    OT Goals(Current goals can be found in the care plan section) Acute Rehab OT Goals Patient Stated Goal: Be able to get back home OT Goal Formulation: With patient Time For Goal Achievement: 07/24/16 Potential to Achieve Goals: Good ADL Goals Pt Will Perform Lower Body Bathing: with supervision;sit to/from stand Pt Will Perform Lower Body Dressing: with supervision;sit  to/from stand Pt Will Transfer to Toilet: with supervision;ambulating;bedside commode (BSC over toilet) Pt Will Perform Toileting - Clothing Manipulation and hygiene: with supervision;sit to/from stand Pt Will Perform Tub/Shower Transfer: with supervision;3 in 1;rolling walker;Tub transfer;shower seat  OT Frequency: Min 2X/week   Barriers to D/C:            Co-evaluation PT/OT/SLP Co-Evaluation/Treatment: Yes Reason for Co-Treatment: For patient/therapist safety;Other (comment) (pt-centered decision for possible decr activity tolerance) PT goals addressed during session: Mobility/safety with mobility        End of Session Equipment Utilized During Treatment: Gait belt;Rolling walker Nurse Communication: Mobility status  Activity Tolerance: Patient tolerated treatment well Patient left: in chair;with call bell/phone within reach;with chair alarm set   Time: 1112-1140 OT Time Calculation (min): 28 min Charges:  OT General Charges $OT Visit: 1 Procedure OT Evaluation $OT Eval Moderate Complexity: 1 Procedure G-CodesDoristine Section, OTR/L [phone] 07/17/2016, 4:52 PM

## 2016-07-17 NOTE — Op Note (Signed)
Deborah Moreno, SIRIANNI NO.:  [id number]  MEDICAL RECORD NO.:  [id number]  LOCATION:  A06C                         FACILITY:  [facility]  PHYSICIAN:  Samson Frederic, MD     DATE OF BIRTH:  Oct 15, 1940  DATE OF PROCEDURE:  07/16/2016 DATE OF DISCHARGE:                              OPERATIVE REPORT   SURGEON:  Samson Frederic, MD.  ASSISTANT:  Staff.  PREOPERATIVE DIAGNOSIS:  Valgus impacted left femoral neck fracture.  POSTOPERATIVE DIAGNOSIS:  Valgus impacted left femoral neck fracture.  PROCEDURE PERFORMED:  Percutaneous screw fixation of left femoral neck.  IMPLANTS:  Biomet 8.0 mm cannulated screws x3.  ANESTHESIA:  General.  ANTIBIOTICS:  2 g Ancef.  COMPLICATIONS:  None.  TUBES AND DRAINS:  None.  SPECIMENS:  None.  DISPOSITION:  Stable to PACU.  INDICATIONS:  The patient is a 76 year old female, who tripped and fell in her kitchen landing on her left hip.  She had left hip pain and inability to weight bear.  She was brought to the emergency department at [facility], where x-rays and a CT scan of the left hip revealed a valgus impacted left femoral neck fracture without any retroversion of the fracture site.  She was admitted to the Hospitalist Service, and she underwent perioperative risk stratification and medical optimization.  The risks, benefits, alternatives to percutaneous screw fixation of the left hip were explained and she elected to proceed.  DESCRIPTION OF PROCEDURE IN DETAIL:  I identified the patient in the holding area using 2 identifiers.  The surgical site was marked by myself.  She was taken to the operating room and general anesthesia was induced on her bed.  She was then placed on the Hana table.  The right lower extremity was scissored underneath the left.  The operative lower extremity was placed in slight internal rotation and adduction with a small amount of traction.  The left hip was prepped and draped in  normal sterile surgical fashion.  Time-out was called verifying side and site of surgery.  She did receive IV antibiotics within 60 minutes of beginning the procedure.  I made a 1 inch incision over the lateral aspect of the femur.  I used a guide pin on the AP view to place the pin in the inferior aspect and on the lateral view in the central aspect of the femoral neck.  The pin was advanced.  I then used the Gatling gun to place 2 superior pins, 1 anterior and 1 posterior thus forming an inverted triangle formation.  The pins were placed under fluoroscopic control.  I sequentially measured, I drilled the near cortex, and placed the 3 cannulated screws.  I obtained radiographs on multiple angles to ensure that there was no chondral penetration.  Reduction of the fracture remained unchanged.  The wounds were then copiously irrigated with saline, and I closed the wounds in layers with 2-0 Monocryl for the deep dermal layer, running 3-0 Monocryl subcuticular stitch and glue for the skin.  Once the glue was dried, Aquacel AG dressing was applied. The patient was then extubated, taken to PACU in stable condition. Sponge, needle, and instrument counts were correct  at the end of the case x2.  There were no complications.  Postoperatively, we will readmit the patient to the hospitalist.  She will be 50% weightbearing left lower extremity with a walker.  Placed her on Lovenox for DVT prophylaxis.  She will work with physical and occupational therapy.  She will need disposition planning.  I will see her in the office 2 weeks after discharge.          ______________________________ Samson FredericBrian Makenzee Choudhry, MD     BS/MEDQ  D:  07/16/2016  T:  07/17/2016  Job:  [id number]

## 2016-07-17 NOTE — Evaluation (Signed)
Physical Therapy Evaluation Patient Details Name: Deborah Moreno Vilar MRN: [id number] DOB: 1941-03-03 Today's Date: 07/17/2016   History of Present Illness  Deborah Moreno is a 76 y.o. female with a past medical history significant for dementia and intermittent asthma who presents with hip pain after a fall, resulting in L hip fx; now s/p Hip pinning, 50%PWB  Clinical Impression   Patient is s/p above surgery resulting in functional limitations due to the deficits listed below (see PT Problem List). Requires mod assist for OOB transfers; will plan to progress gait training with initial +2 assist for safety; Lengthy discussion with daughter re: dc planning here to SNF in the short-term, and then longer term safe living options for Deborah Moreno; after her post-acute rehab course, recommend in-home assist;  Patient will benefit from skilled PT to increase their independence and safety with mobility to allow discharge to the venue listed below.       Follow Up Recommendations SNF    Equipment Recommendations  Rolling walker with 5" wheels;3in1 (PT) (Youth sized)    Recommendations for Other Services       Precautions / Restrictions Precautions Precautions: Fall Restrictions Weight Bearing Restrictions: Yes LLE Weight Bearing: Partial weight bearing LLE Partial Weight Bearing Percentage or Pounds: 50      Mobility  Bed Mobility Overal bed mobility: Needs Assistance Bed Mobility: Supine to Sit     Supine to sit: +2 for safety/equipment;Mod assist     General bed mobility comments: Cues for technique; Good initiation of half bridge to EOB with R LE to unweigh hips; Mod assist to pull to sit and used bed pad to square off hips at EOB  Transfers Overall transfer level: Needs assistance Equipment used: Rolling walker (2 wheeled) Transfers: Sit to/from Stand Sit to Stand: Min assist         General transfer comment: Cues for hand placement, safety, and 50%PWB  LLE  Ambulation/Gait Ambulation/Gait assistance: Mod assist;+2 safety/equipment Ambulation Distance (Feet):  (pivotal steps bed to chair) Assistive device: Rolling walker (2 wheeled)       General Gait Details: Cues for stepping sequencing and to use RW to unweigh LLE in stance while advancing LLE  Stairs            Wheelchair Mobility    Modified Rankin (Stroke Patients Only)       Balance Overall balance assessment: Needs assistance Sitting-balance support: Feet supported;Bilateral upper extremity supported Sitting balance-Leahy Scale: Fair       Standing balance-Leahy Scale: Poor                               Pertinent Vitals/Pain Pain Assessment: Faces Faces Pain Scale: Hurts even more Pain Location: L hip Pain Descriptors / Indicators: Aching;Discomfort;Grimacing;Operative site guarding;Sore Pain Intervention(s): Monitored during session    Home Living Family/patient expects to be discharged to:: Skilled nursing facility Living Arrangements: Children (Son) Available Help at Discharge: Available PRN/intermittently;Family (Son works in Economistafternoon/evenings and gone approx. 1pm-12am) Type of Home: House       Home Layout: One level Home Equipment: Walker - 2 wheels      Prior Function Level of Independence: Independent         Comments: Pt with history of dementia but independent with simple home and self care tasks.     Hand Dominance   Dominant Hand: Right    Extremity/Trunk Assessment   Upper Extremity Assessment Upper Extremity Assessment: Defer to  OT evaluation    Lower Extremity Assessment Lower Extremity Assessment: LLE deficits/detail LLE Deficits / Details: Grosssly decr AROM and strength L hip, limited by pain LLE: Unable to fully assess due to pain       Communication   Communication: No difficulties  Cognition Arousal/Alertness: Awake/alert Behavior During Therapy: WFL for tasks assessed/performed Overall  Cognitive Status: History of cognitive impairments - at baseline                 General Comments: Pt with slightly decreased short-term memory for previous events during session. Pt able to problem solve with minimal verbal cues and has good awareness of safety. She does demonstrate emergent awareness. She is oriented x4.    General Comments      Exercises     Assessment/Plan    PT Assessment Patient needs continued PT services  PT Problem List Decreased strength;Decreased range of motion;Decreased activity tolerance;Decreased balance;Decreased mobility;Decreased coordination;Decreased cognition;Decreased knowledge of use of DME;Decreased safety awareness;Decreased knowledge of precautions;Pain          PT Treatment Interventions DME instruction;Gait training;Functional mobility training;Therapeutic activities;Therapeutic exercise;Balance training;Neuromuscular re-education;Cognitive remediation;Patient/family education    PT Goals (Current goals can be found in the Care Plan section)  Acute Rehab PT Goals Patient Stated Goal: Be able to get back home PT Goal Formulation: With patient Time For Goal Achievement: 07/31/16 Potential to Achieve Goals: Good    Frequency Min 6X/week   Barriers to discharge        Co-evaluation PT/OT/SLP Co-Evaluation/Treatment: Yes Reason for Co-Treatment: For patient/therapist safety;Other (comment) (pt-centered decision for possible decr activity tolerance) PT goals addressed during session: Mobility/safety with mobility OT goals addressed during session: ADL's and self-care       End of Session Equipment Utilized During Treatment: Gait belt Activity Tolerance: Patient tolerated treatment well Patient left: in chair;with call bell/phone within reach;with chair alarm set Nurse Communication: Mobility status         Time: 1105-1140 PT Time Calculation (min) (ACUTE ONLY): 35 min   Charges:   PT Evaluation $PT Eval Moderate  Complexity: 1 Procedure     PT G CodesLevi Aland 07/17/2016, 1:30 PM  Van Clines, PT  Acute Rehabilitation Services Pager [phone] Office [phone]

## 2016-07-18 ENCOUNTER — Encounter (HOSPITAL_COMMUNITY): Payer: Self-pay | Admitting: Orthopedic Surgery

## 2016-07-18 LAB — CBC
HCT: 34 % — ABNORMAL LOW (ref 36.0–46.0)
HEMOGLOBIN: 11.1 g/dL — AB (ref 12.0–15.0)
MCH: 30.3 pg (ref 26.0–34.0)
MCHC: 32.6 g/dL (ref 30.0–36.0)
MCV: 92.9 fL (ref 78.0–100.0)
Platelets: 236 10*3/uL (ref 150–400)
RBC: 3.66 MIL/uL — AB (ref 3.87–5.11)
RDW: 12.8 % (ref 11.5–15.5)
WBC: 4.8 10*3/uL (ref 4.0–10.5)

## 2016-07-18 LAB — BASIC METABOLIC PANEL
Anion gap: 5 (ref 5–15)
BUN: 5 mg/dL — ABNORMAL LOW (ref 6–20)
CALCIUM: 8.1 mg/dL — AB (ref 8.9–10.3)
CO2: 25 mmol/L (ref 22–32)
Chloride: 110 mmol/L (ref 101–111)
Creatinine, Ser: 0.65 mg/dL (ref 0.44–1.00)
Glucose, Bld: 102 mg/dL — ABNORMAL HIGH (ref 65–99)
Potassium: 3.6 mmol/L (ref 3.5–5.1)
SODIUM: 140 mmol/L (ref 135–145)

## 2016-07-18 MED ORDER — MENTHOL 3 MG MT LOZG
1.0000 | LOZENGE | OROMUCOSAL | 0 refills | Status: DC | PRN
Start: 2016-07-18 — End: 2019-08-26

## 2016-07-18 MED ORDER — METHOCARBAMOL 500 MG PO TABS: 500.0000 mg | ORAL_TABLET | Freq: Three times a day (TID) | ORAL | 0 refills | Status: DC | PRN

## 2016-07-18 MED ORDER — POLYETHYLENE GLYCOL 3350 17 G PO PACK: 17.0000 g | PACK | Freq: Every day | ORAL | 0 refills | Status: DC

## 2016-07-18 MED ORDER — CHLORHEXIDINE GLUCONATE CLOTH 2 % EX PADS: 6.0000 | MEDICATED_PAD | Freq: Every day | CUTANEOUS | Status: DC

## 2016-07-18 MED ORDER — SENNOSIDES-DOCUSATE SODIUM 8.6-50 MG PO TABS: 1.0000 | ORAL_TABLET | Freq: Every evening | ORAL | 0 refills | Status: DC | PRN

## 2016-07-18 MED ORDER — ENSURE ENLIVE PO LIQD: 237.0000 mL | Freq: Two times a day (BID) | ORAL | 12 refills | Status: DC

## 2016-07-18 MED ORDER — MUPIROCIN 2 % EX OINT
1.0000 "application " | TOPICAL_OINTMENT | Freq: Two times a day (BID) | CUTANEOUS | Status: DC
  Filled 2016-07-18: qty 22

## 2016-07-18 MED ORDER — ACETAMINOPHEN-CODEINE #3 300-30 MG PO TABS: 1.0000 | ORAL_TABLET | Freq: Four times a day (QID) | ORAL | 0 refills | Status: DC | PRN

## 2016-07-18 NOTE — Progress Notes (Signed)
Physical Therapy Treatment Patient Details Name: Deborah RoysLynda Yoshino MRN: [id number] DOB: Oct 11, 1940 Today's Date: 07/18/2016    History of Present Illness Deborah Moreno is a 76 y.o. female with a past medical history significant for dementia and intermittent asthma who presents with hip pain after a fall, resulting in L hip fx; now s/p Hip pinning, 50%PWB    PT Comments    Very good progress from yesterday's session to today's; Needs continued reinforcement of 50% PWB LLE; Agree with plan for SNF for post-acute rehab.  Follow Up Recommendations  SNF     Equipment Recommendations  Rolling walker with 5" wheels;3in1 (PT) (Youth sized)    Recommendations for Other Services       Precautions / Restrictions Precautions Precautions: Fall Restrictions LLE Weight Bearing: Partial weight bearing LLE Partial Weight Bearing Percentage or Pounds: 50    Mobility  Bed Mobility Overal bed mobility: Needs Assistance Bed Mobility: Supine to Sit     Supine to sit: Mod assist     General bed mobility comments: VC's for technique. Mod assist to square hips and raise trunk.  Transfers Overall transfer level: Needs assistance Equipment used: Rolling walker (2 wheeled) Transfers: Sit to/from Stand Sit to Stand: Min assist         General transfer comment: Cues for hand placement and to maintain PWB status.  Ambulation/Gait Ambulation/Gait assistance: Min assist (family member pushing chair behind) Ambulation Distance (Feet): 40 Feet Assistive device: Rolling walker (2 wheeled) Gait Pattern/deviations: Step-to pattern     General Gait Details: Cues for stepping sequencing and to use RW to unweigh LLE in stance while advancing LLE   Stairs            Wheelchair Mobility    Modified Rankin (Stroke Patients Only)       Balance     Sitting balance-Leahy Scale: Fair       Standing balance-Leahy Scale: Poor                      Cognition Arousal/Alertness:  Awake/alert Behavior During Therapy: WFL for tasks assessed/performed Overall Cognitive Status: History of cognitive impairments - at baseline                 General Comments: Pt with slightly decreased short-term memory for previous events during session. Pt able to problem solve with minimal verbal cues and has good awareness of safety. She does demonstrate emergent awareness. She is oriented x4.    Exercises Total Joint Exercises Ankle Circles/Pumps: AROM;Both;20 reps Gluteal Sets: AROM;Both;10 reps Towel Squeeze: AROM;Both;10 reps Heel Slides: AAROM;Left;10 reps Hip ABduction/ADduction: AAROM;Left;10 reps    General Comments        Pertinent Vitals/Pain Pain Assessment: Faces Faces Pain Scale: Hurts even more Pain Location: L hip with motion; Grimace with therex; Difficulty rating pain when asked Pain Descriptors / Indicators: Aching;Discomfort;Grimacing;Operative site guarding;Sore Pain Intervention(s): Monitored during session    Home Living Family/patient expects to be discharged to:: Skilled nursing facility                    Prior Function            PT Goals (current goals can now be found in the care plan section) Acute Rehab PT Goals PT Goal Formulation: With patient Time For Goal Achievement: 07/31/16 Potential to Achieve Goals: Good Progress towards PT goals: Progressing toward goals    Frequency    Min 3X/week      PT  Plan Current plan remains appropriate;Frequency needs to be updated (given dc plan to SNF)    Co-evaluation             End of Session Equipment Utilized During Treatment: Gait belt Activity Tolerance: Patient tolerated treatment well Patient left: in chair;with call bell/phone within reach;with family/visitor present     Time: 1340-1410 PT Time Calculation (min) (ACUTE ONLY): 30 min  Charges:  $Gait Training: 8-22 mins $Therapeutic Exercise: 8-22 mins                    G Codes:      Levi Aland Aug 07, 2016, 2:26 PM  Van Clines, PT  Acute Rehabilitation Services Pager [phone] Office [phone]

## 2016-07-18 NOTE — Progress Notes (Signed)
   Subjective:  Patient reports pain as mild to moderate.  Denies N/V/CP/SOB.  Objective:   VITALS:   Vitals:   07/17/16 0456 07/17/16 1300 07/17/16 2050 07/18/16 0430  BP: (!) 118/54 (!) 130/104 (!) 155/69 (!) 172/74  Pulse: 79 77 66 65  Resp:   16   Temp: 98.4 F (36.9 C) 98.1 F (36.7 C) 98.9 F (37.2 C) 98.1 F (36.7 C)  TempSrc: Oral Oral Oral Oral  SpO2: 92% 95% 96% 92%  Weight:      Height:        NAD ABD soft Sensation intact distally Intact pulses distally Dorsiflexion/Plantar flexion intact Incision: dressing C/D/I Compartment soft   Lab Results  Component Value Date   WBC 4.8 07/18/2016   HGB 11.1 (L) 07/18/2016   HCT 34.0 (L) 07/18/2016   MCV 92.9 07/18/2016   PLT 236 07/18/2016   BMET    Component Value Date/Time   NA 140 07/18/2016 0553   K 3.6 07/18/2016 0553   CL 110 07/18/2016 0553   CO2 25 07/18/2016 0553   GLUCOSE 102 (H) 07/18/2016 0553   BUN 5 (L) 07/18/2016 0553   CREATININE 0.65 07/18/2016 0553   CALCIUM 8.1 (L) 07/18/2016 0553   GFRNONAA >60 07/18/2016 0553   GFRAA >60 07/18/2016 0553     Assessment/Plan: 2 Days Post-Op   Principal Problem:   Traumatic closed nondisplaced fracture of neck of left femur (HCC) Active Problems:   Memory disorder   Intermittent asthma   Left displaced femoral neck fracture (HCC)  50% WB LLE with walker DVT ppx: lovenox, SCDs, TEDs PO pain control PT/OT Dispo: SNF placement    Zamyra Allensworth, Cloyde ReamsBrian James 07/18/2016, 10:12 AM   Samson FredericBrian Letty Salvi, MD Cell [phone]

## 2016-07-18 NOTE — NC FL2 (Signed)
MEDICAID FL2 LEVEL OF CARE SCREENING TOOL     IDENTIFICATION  Patient Name: Deborah RoysLynda Repka Birthdate: 1941-05-27 Sex: female Admission Date (Current Location): 07/15/2016  [facility] [place]      Facility and Address:         Provider Number:    Attending Physician Name and Address:  Rolly SalterPranav M Patel, MD  Relative Name and Phone Number:       Current Level of Care:   Recommended Level of Care:   Prior Approval Number:    Date Approved/Denied:   PASRR Number: [id number] [id number] (Eff. 07/18/16)  Discharge Plan: SNF    Current Diagnoses: Patient Active Problem List   Diagnosis Date Noted  . Left displaced femoral neck fracture (HCC) 07/16/2016  . Traumatic closed nondisplaced fracture of neck of left femur (HCC) 07/15/2016  . Intermittent asthma 07/15/2016  . Memory disorder 01/02/2014    Orientation RESPIRATION BLADDER Height & Weight     Self, Situation, Place  Normal Continent Weight: 100 lb (45.4 kg) Height:  4\' 10"  (147.3 cm)  BEHAVIORAL SYMPTOMS/MOOD NEUROLOGICAL BOWEL NUTRITION STATUS      Continent Diet (Low sodium - Heart healthy)  AMBULATORY STATUS COMMUNICATION OF NEEDS Skin   Extensive Assist Verbally Other (Comment) (incision left let, excoriation)                       Personal Care Assistance Level of Assistance  Bathing, Feeding, Dressing Bathing Assistance: Maximum assistance (Min assist upper body/Max assist lower body) Feeding assistance: Limited assistance (assistance with set-up) Dressing Assistance: Maximum assistance (Min assist upper body/Max assist lower body)     Functional Limitations Info  Sight, Hearing, Speech Sight Info: Adequate Hearing Info: Adequate Speech Info: Adequate    SPECIAL CARE FACTORS FREQUENCY  PT (By licensed PT), OT (By licensed OT)     PT Frequency: Evaluated 1/21 and a minimum of 6X per week therapy recommended OT Frequency: Evaluated 1/21 and a minimum of 2X per week therapy  recommended            Contractures Contractures Info: Not present    Additional Factors Info  Code Status, Allergies Code Status Info: Full Allergies Info: Anesthetics, Lovastatin           Current Medications (07/18/2016):  This is the current hospital active medication list Current Facility-Administered Medications  Medication Dose Route Frequency Provider Last Rate Last Dose  . acetaminophen (TYLENOL) tablet 650 mg  650 mg Oral Q6H PRN Samson FredericBrian Swinteck, MD   650 mg at 07/17/16 1623   Or  . acetaminophen (TYLENOL) suppository 650 mg  650 mg Rectal Q6H PRN Samson FredericBrian Swinteck, MD      . acetaminophen-codeine (TYLENOL #3) 300-30 MG per tablet 1 tablet  1 tablet Oral Q6H PRN Rolly SalterPranav M Patel, MD      . albuterol (PROVENTIL) (2.5 MG/3ML) 0.083% nebulizer solution 3 mL  3 mL Inhalation TID PRN Alberteen Samhristopher P Danford, MD      . bisacodyl (DULCOLAX) suppository 10 mg  10 mg Rectal Daily PRN Alberteen Samhristopher P Danford, MD      . buPROPion (WELLBUTRIN XL) 24 hr tablet 150 mg  150 mg Oral Daily Alberteen Samhristopher P Danford, MD   150 mg at 07/18/16 0852  . Chlorhexidine Gluconate Cloth 2 % PADS 6 each  6 each Topical Daily Rolly SalterPranav M Patel, MD      . donepezil (ARICEPT) tablet 10 mg  10 mg Oral QHS Alberteen Samhristopher P Danford, MD  10 mg at 07/17/16 2234  . enoxaparin (LOVENOX) injection 40 mg  40 mg Subcutaneous Q24H Samson Frederic, MD   40 mg at 07/18/16 0853  . feeding supplement (ENSURE ENLIVE) (ENSURE ENLIVE) liquid 237 mL  237 mL Oral BID BM Rolly Salter, MD   237 mL at 07/18/16 0918  . HYDROcodone-acetaminophen (NORCO/VICODIN) 5-325 MG per tablet 1-2 tablet  1-2 tablet Oral Q6H PRN Alberteen Sam, MD   1 tablet at 07/18/16 0437  . menthol-cetylpyridinium (CEPACOL) lozenge 3 mg  1 lozenge Oral PRN Samson Frederic, MD       Or  . phenol (CHLORASEPTIC) mouth spray 1 spray  1 spray Mouth/Throat PRN Samson Frederic, MD      . methocarbamol (ROBAXIN) tablet 500 mg  500 mg Oral TID Rolly Salter, MD   500 mg at  07/18/16 1610  . metoCLOPramide (REGLAN) tablet 5-10 mg  5-10 mg Oral Q8H PRN Samson Frederic, MD       Or  . metoCLOPramide (REGLAN) injection 5-10 mg  5-10 mg Intravenous Q8H PRN Samson Frederic, MD      . morphine 2 MG/ML injection 0.5 mg  0.5 mg Intravenous Q2H PRN Samson Frederic, MD      . mupirocin ointment (BACTROBAN) 2 % 1 application  1 application Nasal BID Rolly Salter, MD      . ondansetron [facility]) tablet 4 mg  4 mg Oral Q6H PRN Samson Frederic, MD   4 mg at 07/18/16 9604   Or  . ondansetron (ZOFRAN) injection 4 mg  4 mg Intravenous Q6H PRN Samson Frederic, MD   4 mg at 07/18/16 0229  . polyethylene glycol (MIRALAX / GLYCOLAX) packet 17 g  17 g Oral Daily Rolly Salter, MD   17 g at 07/18/16 0853  . senna-docusate (Senokot-S) tablet 1 tablet  1 tablet Oral BID Rolly Salter, MD   1 tablet at 07/18/16 5409     Discharge Medications: Please see discharge summary for a list of discharge medications.  Relevant Imaging Results:  Relevant Lab Results:   Additional Information ss#[id number].  Cristobal Goldmann, LCSW

## 2016-07-18 NOTE — Clinical Social Work Note (Signed)
Clinical Social Work Assessment  Patient Details  Name: Deborah Moreno MRN: [id number] Date of Birth: Apr 15, 1941  Date of referral:  07/18/16               Reason for consult:  Facility Placement                Permission sought to share information with:  Family Supports Permission granted to share information::  Yes, Verbal Permission Granted  Name::     Deborah Moreno  Agency::     Relationship::  Son and Scientist, clinical (histocompatibility and immunogenetics)daugher  Contact Information:  Deborah [phone]) and Deborah Moreno [phone])  Housing/Transportation Living arrangements for the past 2 months:  Single Family Home Source of Information:  Patient, Adult Children Patient Interpreter Needed:  None Criminal Activity/Legal Involvement Pertinent to Current Situation/Hospitalization:  No - Comment as needed Significant Relationships:  Adult Children, Siblings Lives with:  Adult Children (Patient and son Deborah together) Do you feel safe going back to the place where you live?  No (Patient agreeable to ST rehab as her son works in the evenings and [place] lives in [place]) Need for family participation in patient care:  Yes (Comment)  Care giving concerns:  Patient reports that rehab needed as she is alone while her son is at work.   Social Worker assessment / plan:  CSW talked with patient at the bedside regarding discharge disposition and recommendation of ST rehab. Also in the room was patient's son Deborah Moreno and brother Deborah Moreno. Patient was lying in bed and was alert, oriented, pleasant and open to speaking with CSW regarding her discharge plans. CSW talked with patient and family regarding the facility search process and patient [place] [facility]. Patient and family advised that Deborah Moreno deceased husband had been to [facility] and this is where she wants to go.  CSW requested a 2nd choice just in case their preference unable to take her due to bed availability. CSW also spoke with  patient's daughter who was anxious as she lives and works in [place] and wanted an update on her mother's discharge disposition. This was given, along with facility preference and she was emailed [place] [facility] [place] [facility].  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 CSW was informed by daughter that they were told patient would be ready for d/c on Tuesday and this is why she returned to work.  Patient/Family's Understanding of and Emotional Response to Diagnosis, Current Treatment, and Prognosis:  Not discussed.  Emotional Assessment Appearance:  Appears stated age Attitude/Demeanor/Rapport:  Other (Appropriate) Affect (typically observed):  Pleasant, Appropriate Orientation:  Oriented to Self, Oriented to Place, Oriented to  Time, Oriented to Situation Alcohol / Substance use:  Tobacco Use, Alcohol Use, Illicit Drugs (Patient is a former smoker and does not drink or use illicit drugs) Psych involvement (Current and /or in the community):  No (Comment)  Discharge Needs  Concerns to be addressed:  Discharge Planning Concerns Readmission within the last 30 days:  No Current discharge risk:  None Barriers to Discharge:  No Barriers Identified   Cristobal GoldmannCrawford, Abhiram Criado Bradley, LCSW 07/18/2016, 3:01 PM

## 2016-07-18 NOTE — Discharge Summary (Signed)
[facility] Discharge Summary   Patient: Deborah Moreno ZOX:[id number]   PCP: Deborah Fillers, MD DOB: 06-22-1941   Date of admission: 07/15/2016   Date of discharge:  07/18/2016    Discharge Diagnoses:  Principal Problem:   Traumatic closed nondisplaced fracture of neck of left femur (HCC) Active Problems:   Memory disorder   Intermittent asthma   Left displaced femoral neck fracture (HCC)   Admitted From: home Disposition:  SNF  Recommendations for Outpatient Follow-up:  1.    Follow-up Information    Swinteck, Cloyde Reams, MD. Schedule an appointment as soon as possible for a visit in 2 weeks.   Specialty:  Orthopedic Surgery Why:  For wound re-check Contact information: [address] [place] [address] [phone]          Diet recommendation: cardiac diet  Activity: The patient is advised to gradually reintroduce usual activities.  Discharge Condition: good  Code Status: full code  History of present illness: As per the H and P dictated on admission, "Deborah Moreno is a 76 y.o. female with a past medical history significant for dementia and intermittent asthma who presents with hip pain after a fall.  The patient was in her normal state of health until this evening when she was at the sink, sometime after midnight, had a mechanical fall and had immediate LEFT hip pain and could not walk.  She did hit her head but has no headache or neck pain now.  There was no preceding dizziness, weakness, lightheadedness or vertigo, no chest discomfort, palpitations, or dyspnea, nor are there any of those symptoms now"  Hospital Course:   Summary of her active problems in the hospital is as following. 1. Closed left fracture of neck femur. S/P Percutaneous screw fixation of left femoral neck 07/16/2016 Post-op blood loss anemia  Appreciate orthopedic input. PTOT consulted. Patient tolerated the procedure well. Post-op blood loss, Hb stable,  recheck in 1 week.   2. Intermittent asthma. Continue when necessary nebulizer.  3. Dementia. Mood disorder. Continue Wellbutrin continue Aricept.  All other chronic medical condition were stable during the hospitalization.  Patient was seen by physical therapy, who recommended SNF, which was arranged by Child psychotherapist and case Production designer, theatre/television/film. On the day of the discharge the patient's vitals were stable were stable, and no other acute medical condition were reported by patient. the patient was felt safe to be discharge at SNF with therapy.  Procedures and Results:  Percutaneous screw fixation of left femoral neck.   Consultations:  Orthopedics  DISCHARGE MEDICATION: Current Discharge Medication List    START taking these medications   Details  acetaminophen-codeine (TYLENOL #3) 300-30 MG tablet Take 1 tablet by mouth every 6 (six) hours as needed for moderate pain. Qty: 20 tablet, Refills: 0    feeding supplement, ENSURE ENLIVE, (ENSURE ENLIVE) LIQD Take 237 mLs by mouth 2 (two) times daily between meals. Qty: 237 mL, Refills: 12    menthol-cetylpyridinium (CEPACOL) 3 MG lozenge Take 1 lozenge (3 mg total) by mouth as needed for sore throat (sore throat). Qty: 100 tablet, Refills: 0    methocarbamol (ROBAXIN) 500 MG tablet Take 1 tablet (500 mg total) by mouth every 8 (eight) hours as needed for muscle spasms. Qty: 21 tablet, Refills: 0    polyethylene glycol (MIRALAX / GLYCOLAX) packet Take 17 g by mouth daily. Qty: 14 each, Refills: 0    senna-docusate (SENOKOT-S) 8.6-50 MG tablet Take 1 tablet by mouth at bedtime as needed for  mild constipation. Qty: 30 tablet, Refills: 0      CONTINUE these medications which have NOT CHANGED   Details  albuterol (PROVENTIL HFA;VENTOLIN HFA) 108 (90 BASE) MCG/ACT inhaler Inhale 2 puffs into the lungs 3 (three) times daily as needed for wheezing or shortness of breath.    buPROPion (WELLBUTRIN XL) 150 MG 24 hr tablet Take 150 mg by mouth  daily.     celecoxib (CELEBREX) 200 MG capsule Take 200 mg by mouth 2 (two) times daily as needed for mild pain.     Cyanocobalamin (VITAMIN B-12 PO) Take 1 tablet by mouth daily.     donepezil (ARICEPT) 10 MG tablet Take 1 tablet (10 mg total) by mouth at bedtime. Qty: 30 tablet, Refills: 6    Vitamin D, Ergocalciferol, (DRISDOL) 50000 units CAPS capsule Take 50,000 Units by mouth every 7 (seven) days.       Allergies  Allergen Reactions  . Anesthetics, Halogenated Other (See Comments)    unknown  . Lovastatin Other (See Comments)    MUSCLE ACHES   Discharge Instructions    Diet - low sodium heart healthy    Complete by:  As directed    Increase activity slowly    Complete by:  As directed      Discharge Exam: Filed Weights   07/15/16 0414  Weight: 45.4 kg (100 lb)   Vitals:   07/17/16 2050 07/18/16 0430  BP: (!) 155/69 (!) 172/74  Pulse: 66 65  Resp: 16   Temp: 98.9 F (37.2 C) 98.1 F (36.7 C)   General: Appear in no distress, no Rash; Oral Mucosa moist. Cardiovascular: S1 and S2 Present, no Murmur, no JVD Respiratory: Bilateral Air entry present and Clear to Auscultation, no Crackles, no wheezes Abdomen: Bowel Sound present, Soft and no tenderness Extremities: no Pedal edema, no calf tenderness Neurology: Grossly no focal neuro deficit.  The results of significant diagnostics from this hospitalization (including imaging, microbiology, ancillary and laboratory) are listed below for reference.    Significant Diagnostic Studies: Ct Hip Left Wo Contrast  Result Date: 07/15/2016 CLINICAL DATA:  Left hip pain secondary to a fall. EXAM: CT OF THE LEFT HIP WITHOUT CONTRAST TECHNIQUE: Multidetector CT imaging of the left the other visualized pelvic bones are intact. Only the more COMPARISON:  Radiographs dated 07/15/2016 FINDINGS: Bones/Joint/Cartilage There is a slightly impacted subcapital fracture of the left femoral neck. No displacement. Small joint effusion.  IMPRESSION: Slightly impacted subcapital fracture of the left femoral neck. Electronically Signed   By: Francene BoyersJames  Maxwell M.D.   On: 07/15/2016 08:19   Dg C-arm 1-60 Min  Result Date: 07/16/2016 CLINICAL DATA:  ORIF left hip fracture EXAM: DG C-ARM 61-120 MIN; OPERATIVE LEFT HIP WITH PELVIS COMPARISON:  None. FLUOROSCOPY TIME:  1 minutes 14 seconds FINDINGS: Left femoral neck fracture transfixed with 2 cannulated femoral neck screws in near anatomic alignment. No hardware failure or complication. IMPRESSION: Interval ORIF of a left femoral neck fracture. Electronically Signed   By: Elige KoHetal  Jermani Pund   On: 07/16/2016 10:29   Dg Hip Port Unilat With Pelvis 1v Left  Result Date: 07/16/2016 CLINICAL DATA:  Status post left hip surgery EXAM: DG HIP (WITH OR WITHOUT PELVIS) 1V PORT LEFT COMPARISON:  None. FINDINGS: Left femoral neck fracture transfixed with 3 cannulated screws. No failure or complication. Normal alignment. IMPRESSION: Interval ORIF of a left femoral neck fracture. Electronically Signed   By: Elige KoHetal  Sinthia Karabin   On: 07/16/2016 10:58   Dg Hip Operative  Unilat W Or W/o Pelvis Left  Result Date: 07/16/2016 CLINICAL DATA:  ORIF left hip fracture EXAM: DG C-ARM 61-120 MIN; OPERATIVE LEFT HIP WITH PELVIS COMPARISON:  None. FLUOROSCOPY TIME:  1 minutes 14 seconds FINDINGS: Left femoral neck fracture transfixed with 2 cannulated femoral neck screws in near anatomic alignment. No hardware failure or complication. IMPRESSION: Interval ORIF of a left femoral neck fracture. Electronically Signed   By: Elige Ko   On: 07/16/2016 10:29   Dg Hip Unilat W Or Wo Pelvis 2-3 Views Left  Result Date: 07/15/2016 CLINICAL DATA:  Post fall tonight with anterior thigh pain. Unable to weightbear. EXAM: DG HIP (WITH OR WITHOUT PELVIS) 2-3V LEFT COMPARISON:  None. FINDINGS: Impacted fracture of the left femoral neck. No significant displacement. Femoral head remains seated in the acetabulum. Pubic rami and remainder the bony  pelvis is intact. IMPRESSION: Impaction fracture of the left femoral neck. Electronically Signed   By: Rubye Oaks M.D.   On: 07/15/2016 04:53    Microbiology: Recent Results (from the past 240 hour(s))  Surgical pcr screen     Status: Abnormal   Collection Time: 07/15/16  6:55 AM  Result Value Ref Range Status   MRSA, PCR NEGATIVE NEGATIVE Final   Staphylococcus aureus POSITIVE (A) NEGATIVE Final    Comment:        The Xpert SA Assay (FDA approved for NASAL specimens in patients over 28 years of age), is one component of a comprehensive surveillance program.  Test performance has been validated by [facility] for patients greater than or equal to 62 year old. It is not intended to diagnose infection nor to guide or monitor treatment.      Labs: CBC:  Recent Labs Lab 07/15/16 0513 07/16/16 0409 07/17/16 0543 07/18/16 0553  WBC 12.3* 7.0 5.2 4.8  NEUTROABS 10.9*  --   --   --   HGB 13.2 12.7 10.6* 11.1*  HCT 40.0 38.7 32.7* 34.0*  MCV 93.5 92.8 92.9 92.9  PLT 266 259 220 236   Basic Metabolic Panel:  Recent Labs Lab 07/15/16 0513 07/16/16 0409 07/17/16 0543 07/18/16 0553  NA 139 138 139 140  K 3.6 3.4* 3.5 3.6  CL 105 105 108 110  CO2 22 23 24 25   GLUCOSE 106* 102* 90 102*  BUN 18 8 7  5*  CREATININE 0.76 0.68 0.78 0.65  CALCIUM 9.2 9.0 8.1* 8.1*   Time spent: 30 minutes  Signed:  Esco Joslyn  [facility]  07/18/2016  , 2:47 PM

## 2016-07-18 NOTE — Clinical Social Work Placement (Signed)
   CLINICAL SOCIAL WORK PLACEMENT  NOTE 07/18/16 - DISCHARGED TO [facility]IA [facility]BULANCE  Date:  07/18/2016  Patient Details  Name: Deborah Moreno MRN: [id number] Date of Birth: October 17, 1940  Clinical Social Work is seeking post-discharge placement for this patient at the Skilled  Nursing Facility level of care (*CSW will initial, date and re-position this form in  chart as items are completed):  Yes   Patient/family provided with [facility] Clinical Social Work Department's list of facilities offering this level of care within the geographic area requested by the patient (or if unable, by the patient's family).  Yes   Patient/family informed of their freedom to choose among providers that offer the needed level of care, that participate in Medicare, Medicaid or managed care program needed by the patient, have an available bed and are willing to accept the patient.  Yes   Patient/family informed of [facility] ownership interest in [facility] and [facility], as well as of the fact that they are under no obligation to receive care at these facilities.  PASRR submitted to EDS on 07/18/16     PASRR number received on 07/18/16     Existing PASRR number confirmed on       FL2 transmitted to all facilities in geographic area requested by pt/family on 07/18/16     FL2 transmitted to all facilities within larger geographic area on       Patient informed that his/her managed care company has contracts with or will negotiate with certain facilities, including the following:        Yes   Patient/family informed of bed offers received.  Patient chooses bed at [facility]     Physician recommends and patient chooses bed at      Patient to be transferred to [facility] on 07/18/16.  Patient to be transferred to facility by ambulance     Patient family notified on 07/18/16 of transfer.  Name of family member notified:  Daughter Merleen NicelyDonna Dancausse by phone and son Thresa RossButch Bertran  and brother Richardo HanksBilly Hawley at the bedside.     PHYSICIAN       Additional Comment:    _______________________________________________ Cristobal Goldmannrawford, Shelanda Duvall Bradley, LCSW 07/18/2016, 3:09 PM

## 2016-07-21 ENCOUNTER — Non-Acute Institutional Stay (SKILLED_NURSING_FACILITY): Payer: Medicare Other | Admitting: Internal Medicine

## 2016-07-21 ENCOUNTER — Encounter: Payer: Self-pay | Admitting: Internal Medicine

## 2016-07-21 DIAGNOSIS — D62 Acute posthemorrhagic anemia: Secondary | ICD-10-CM

## 2016-07-21 DIAGNOSIS — K5900 Constipation, unspecified: Secondary | ICD-10-CM | POA: Diagnosis not present

## 2016-07-21 DIAGNOSIS — F32A Depression, unspecified: Secondary | ICD-10-CM

## 2016-07-21 DIAGNOSIS — R2681 Unsteadiness on feet: Secondary | ICD-10-CM

## 2016-07-21 DIAGNOSIS — J4520 Mild intermittent asthma, uncomplicated: Secondary | ICD-10-CM

## 2016-07-21 DIAGNOSIS — F0390 Unspecified dementia without behavioral disturbance: Secondary | ICD-10-CM | POA: Diagnosis not present

## 2016-07-21 DIAGNOSIS — F329 Major depressive disorder, single episode, unspecified: Secondary | ICD-10-CM

## 2016-07-21 DIAGNOSIS — S72002S Fracture of unspecified part of neck of left femur, sequela: Secondary | ICD-10-CM

## 2016-07-21 NOTE — Progress Notes (Signed)
LOCATION: [facility]  PCP: [name], MD   Code Status: Full Code  Goals of care: Advanced Directive information Advanced Directives 07/15/2016  Does Patient Have a Medical Advance Directive? No  Type of Advance Directive -  Copy of Healthcare Power of Attorney in Chart? -       Extended Emergency Contact Information Primary Emergency Contact: Odelia Gage [place] Mobile Phone: [phone] Relation: Son Secondary Emergency Contact: Roselle Locus [place] Mobile Phone: [phone] Relation: Daughter   Allergies  Allergen Reactions  . Anesthetics, Halogenated Other (See Comments)    unknown  . Lovastatin Other (See Comments)    MUSCLE ACHES    Chief Complaint  Patient presents with  . New Admit To SNF    New Admission Visit      HPI:  Patient is a 76 y.o. female seen today for short term rehabilitation post hospital admission from 19th of January 2018-07/18/2016 post fall with traumatic closed nondisplaced fracture of neck of left femur. She underwent percutaneous screw fixation on 07/16/2016. She is seen in her room today with her son present at bedside. Her pain is under control at present. She complains of nausea and has concerns of Tylenol 3 contribution to this. She has medical history of dementia, asthma among others.  Review of Systems:  Constitutional: Negative for fever, chills, diaphoresis.  HENT: Negative for headache, congestion, nasal discharge, sore throat, difficulty swallowing.   Eyes: Negative for eye pain, blurred vision, double vision and discharge. Wears glasses. Respiratory: Negative for cough, shortness of breath and wheezing.   Cardiovascular: Negative for chest pain, palpitations, leg swelling.  Gastrointestinal: Negative for heartburn, vomiting, abdominal pain, loss of appetite, melena, diarrhea and constipation. Positive for nausea. Last bowel movement was this morning. Genitourinary:  Negative for dysuria and flank pain.  Musculoskeletal: Negative for back pain, fall in the facility.  Skin: Negative for itching, rash.  Neurological: Negative for dizziness. Psychiatric/Behavioral: Negative for depression   Past Medical History:  Diagnosis Date  . Atypical chest pain   . Childhood asthma   . Colon polyps   . Degenerative arthritis   . GERD (gastroesophageal reflux disease)   . Left knee injury    DISLOCATION  . 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 Vitamin B12 deficiency    Past Surgical History:  Procedure Laterality Date  . CARPAL TUNNEL RELEASE Right 1995  . COLONOSCOPY  2006   OCT  . DILATION AND CURETTAGE OF UTERUS  1981   1990  . HIP PINNING,CANNULATED Left 07/16/2016   Procedure: CANNULATED HIP PINNING;  Surgeon: Samson Frederic, MD;  Location: [facility] OR;  Service: Orthopedics;  Laterality: Left;  . TONSILLECTOMY AND ADENOIDECTOMY  1944  . VAGINAL HYSTERECTOMY  2003  . VOLAR GANGLION CYST     INJECTION   Social History:   reports that she has never smoked. She has never used smokeless tobacco. She reports that she does not drink alcohol or use drugs.  Family History  Problem Relation Age of Onset  . Breast cancer Mother   . Parkinsonism Father   . Alzheimer's disease Father   . Dementia Father     Medications: Allergies as of 07/21/2016      Reactions   Anesthetics, Halogenated Other (See Comments)   unknown   Lovastatin Other (See Comments)   MUSCLE ACHES      Medication List       Accurate as of 07/21/16  3:06 PM. Always use your most  recent med list.          acetaminophen-codeine 300-30 MG tablet Commonly known as:  TYLENOL #3 Take 1 tablet by mouth every 6 (six) hours as needed for moderate pain.   albuterol 108 (90 Base) MCG/ACT inhaler Commonly known as:  PROVENTIL HFA;VENTOLIN HFA Inhale 2 puffs into the lungs 3 (three) times daily as needed for wheezing or shortness of breath.   buPROPion 150 MG 24 hr tablet Commonly known as:   WELLBUTRIN XL Take 150 mg by mouth daily.   celecoxib 200 MG capsule Commonly known as:  CELEBREX Take 200 mg by mouth 2 (two) times daily as needed for mild pain.   donepezil 10 MG tablet Commonly known as:  ARICEPT Take 1 tablet (10 mg total) by mouth at bedtime.   feeding supplement (ENSURE ENLIVE) Liqd Take 237 mLs by mouth 2 (two) times daily between meals.   menthol-cetylpyridinium 3 MG lozenge Commonly known as:  CEPACOL Take 1 lozenge (3 mg total) by mouth as needed for sore throat (sore throat).   methocarbamol 500 MG tablet Commonly known as:  ROBAXIN Take 1 tablet (500 mg total) by mouth every 8 (eight) hours as needed for muscle spasms.   polyethylene glycol packet Commonly known as:  MIRALAX / GLYCOLAX Take 17 g by mouth daily.   senna-docusate 8.6-50 MG tablet Commonly known as:  Senokot-S Take 1 tablet by mouth at bedtime as needed for mild constipation.   VITAMIN B-12 PO Take 1 tablet by mouth daily.   Vitamin D (Ergocalciferol) 50000 units Caps capsule Commonly known as:  DRISDOL Take 50,000 Units by mouth every 7 (seven) days.       Immunizations: Immunization History  Administered Date(s) Administered  . PPD Test 07/18/2016     Physical Exam: Vitals:   07/21/16 1459  BP: 128/68  Pulse: 88  Resp: 18  SpO2: 97%  Weight: 103 lb 12.8 oz (47.1 kg)  Height: 4\' 10"  (1.473 m)   Body mass index is 21.69 kg/m.  General- elderly female, well built, in no acute distress Head- normocephalic, atraumatic Nose- no nasal discharge Throat- moist mucus membrane Eyes- PERRLA, EOMI, no pallor, no icterus, no discharge, normal conjunctiva, normal sclera Neck- no cervical lymphadenopathy Cardiovascular- normal s1,s2, no murmur Respiratory- bilateral clear to auscultation, no wheeze, no rhonchi, no crackles, no use of accessory muscles Abdomen- bowel sounds present, soft, non tender, no guarding or rigidity, no CVA tenderness Musculoskeletal- able to  move all 4 extremities, Limited range of motion of left leg, no leg edema, arthritis changes to her fingers Neurological- alert and oriented to person, place and time Skin- warm and dry, Aquacel dressing to left thigh, dressing clean and dry Psychiatry- normal mood and affect    Labs reviewed: Basic Metabolic Panel:  Recent Labs  16/10/96 0409 07/17/16 0543 07/18/16 0553  NA 138 139 140  K 3.4* 3.5 3.6  CL 105 108 110  CO2 23 24 25   GLUCOSE 102* 90 102*  BUN 8 7 5*  CREATININE 0.68 0.78 0.65  CALCIUM 9.0 8.1* 8.1*   Liver Function Tests: No results for input(s): AST, ALT, ALKPHOS, BILITOT, PROT, ALBUMIN in the last 8760 hours. No results for input(s): LIPASE, AMYLASE in the last 8760 hours. No results for input(s): AMMONIA in the last 8760 hours. CBC:  Recent Labs  07/15/16 0513 07/16/16 0409 07/17/16 0543 07/18/16 0553  WBC 12.3* 7.0 5.2 4.8  NEUTROABS 10.9*  --   --   --  HGB 13.2 12.7 10.6* 11.1*  HCT 40.0 38.7 32.7* 34.0*  MCV 93.5 92.8 92.9 92.9  PLT 266 259 220 236    Radiological Exams: Ct Hip Left Wo Contrast  Result Date: 07/15/2016 CLINICAL DATA:  Left hip pain secondary to a fall. EXAM: CT OF THE LEFT HIP WITHOUT CONTRAST TECHNIQUE: Multidetector CT imaging of the left the other visualized pelvic bones are intact. Only the more COMPARISON:  Radiographs dated 07/15/2016 FINDINGS: Bones/Joint/Cartilage There is a slightly impacted subcapital fracture of the left femoral neck. No displacement. Small joint effusion. IMPRESSION: Slightly impacted subcapital fracture of the left femoral neck. Electronically Signed   By: Francene BoyersJames  Maxwell M.D.   On: 07/15/2016 08:19   Dg C-arm 1-60 Min  Result Date: 07/16/2016 CLINICAL DATA:  ORIF left hip fracture EXAM: DG C-ARM 61-120 MIN; OPERATIVE LEFT HIP WITH PELVIS COMPARISON:  None. FLUOROSCOPY TIME:  1 minutes 14 seconds FINDINGS: Left femoral neck fracture transfixed with 2 cannulated femoral neck screws in near anatomic  alignment. No hardware failure or complication. IMPRESSION: Interval ORIF of a left femoral neck fracture. Electronically Signed   By: Elige KoHetal  Patel   On: 07/16/2016 10:29   Dg Hip Port Unilat With Pelvis 1v Left  Result Date: 07/16/2016 CLINICAL DATA:  Status post left hip surgery EXAM: DG HIP (WITH OR WITHOUT PELVIS) 1V PORT LEFT COMPARISON:  None. FINDINGS: Left femoral neck fracture transfixed with 3 cannulated screws. No failure or complication. Normal alignment. IMPRESSION: Interval ORIF of a left femoral neck fracture. Electronically Signed   By: Elige KoHetal  Patel   On: 07/16/2016 10:58   Dg Hip Operative Unilat W Or W/o Pelvis Left  Result Date: 07/16/2016 CLINICAL DATA:  ORIF left hip fracture EXAM: DG C-ARM 61-120 MIN; OPERATIVE LEFT HIP WITH PELVIS COMPARISON:  None. FLUOROSCOPY TIME:  1 minutes 14 seconds FINDINGS: Left femoral neck fracture transfixed with 2 cannulated femoral neck screws in near anatomic alignment. No hardware failure or complication. IMPRESSION: Interval ORIF of a left femoral neck fracture. Electronically Signed   By: Elige KoHetal  Patel   On: 07/16/2016 10:29   Dg Hip Unilat W Or Wo Pelvis 2-3 Views Left  Result Date: 07/15/2016 CLINICAL DATA:  Post fall tonight with anterior thigh pain. Unable to weightbear. EXAM: DG HIP (WITH OR WITHOUT PELVIS) 2-3V LEFT COMPARISON:  None. FINDINGS: Impacted fracture of the left femoral neck. No significant displacement. Femoral head remains seated in the acetabulum. Pubic rami and remainder the bony pelvis is intact. IMPRESSION: Impaction fracture of the left femoral neck. Electronically Signed   By: Rubye OaksMelanie  Ehinger M.D.   On: 07/15/2016 04:53    Assessment/Plan  Unsteady gait With recent left femur fracture. Will need to work with therapy team to help regain strength and restore balance  Nondisplaced fracture of neck of left femur Status post percutaneous screw fixation. Will need follow-up with orthopedic. Continue Robaxin 500 mg every  8 hours as needed for muscle spasm. Continue celecoxib 200 mg twice a day as needed for pain. Discontinue Tylenol 3 with complaints of nausea. Start Tylenol extra strength 500 mg 1-2 tablet every 6 hours as needed for pain and get PMR consult. Fall precautions to be taken. Will have patient work with physical therapy and occupational therapy to help with gait training and safest transfers.  Blood loss anemia With recent surgery. Monitor H&H  Constipation Continue MiraLAX daily and Senokot as needed  Dementia Provide supportive care. Continue Aricept 10 mg daily  Asthma Stable breathing. Continue Proventil  3 times a day as needed  Chronic depression Continue Wellbutrin, no changes made, monitor her mood    Goals of care: short term rehabilitation   Labs/tests ordered: cbc, bmp  Family/ staff Communication: reviewed care plan with patient, her son and nursing supervisor    Oneal Grout, MD Internal Medicine [facility] [address] [place], [place] [address] Cell Phone (Monday-Friday 8 am - 5 pm): [phone] On Call: [phone] and follow prompts after 5 pm and on weekends Office Phone: [phone] Office Fax: [phone]

## 2016-07-24 DIAGNOSIS — R2681 Unsteadiness on feet: Secondary | ICD-10-CM | POA: Insufficient documentation

## 2016-07-25 LAB — CBC AND DIFFERENTIAL
HEMATOCRIT: 35 % — AB (ref 36–46)
Hemoglobin: 11.4 g/dL — AB (ref 12.0–16.0)
Platelets: 367 10*3/uL (ref 150–399)
WBC: 5.3 10*3/mL

## 2016-07-25 LAB — BASIC METABOLIC PANEL
BUN: 10 mg/dL (ref 4–21)
Creatinine: 0.7 mg/dL (ref 0.5–1.1)
GLUCOSE: 86 mg/dL
Potassium: 3.9 mmol/L (ref 3.4–5.3)
SODIUM: 144 mmol/L (ref 137–147)

## 2016-07-29 ENCOUNTER — Non-Acute Institutional Stay (SKILLED_NURSING_FACILITY): Payer: Medicare Other | Admitting: Family

## 2016-07-29 ENCOUNTER — Encounter: Payer: Self-pay | Admitting: Family

## 2016-07-29 DIAGNOSIS — J4520 Mild intermittent asthma, uncomplicated: Secondary | ICD-10-CM | POA: Diagnosis not present

## 2016-07-29 DIAGNOSIS — F32A Depression, unspecified: Secondary | ICD-10-CM

## 2016-07-29 DIAGNOSIS — F0390 Unspecified dementia without behavioral disturbance: Secondary | ICD-10-CM

## 2016-07-29 DIAGNOSIS — R2681 Unsteadiness on feet: Secondary | ICD-10-CM

## 2016-07-29 DIAGNOSIS — S72009A Fracture of unspecified part of neck of unspecified femur, initial encounter for closed fracture: Secondary | ICD-10-CM

## 2016-07-29 DIAGNOSIS — F329 Major depressive disorder, single episode, unspecified: Secondary | ICD-10-CM | POA: Diagnosis not present

## 2016-07-29 DIAGNOSIS — K5901 Slow transit constipation: Secondary | ICD-10-CM

## 2016-07-29 NOTE — Progress Notes (Signed)
Location:  [facility] Room Number: 501-P Place of Service:  SNF [phone])  Provider: Richarda Blade FNP-C   PCP: [name], MD Patient Care Team: Jarome Matin, MD as PCP - General (Internal Medicine)  Extended Emergency Contact Information Primary Emergency Contact: [facility] Address: [address]          [place], [place] [address] [place] Home Phone: [phone] Mobile Phone: [phone] Relation: Son Secondary Emergency Contact: Dancausse,Donna Address: [address]          [place], [place] Mobile Phone: [phone] Relation: Daughter  Code Status: Full Code  Goals of care:  Advanced Directive information Advanced Directives 07/15/2016  Does Patient Have a Medical Advance Directive? No  Type of Advance Directive -  Copy of Healthcare Power of Attorney in Chart? -     Allergies  Allergen Reactions  . Anesthetics, Halogenated Other (See Comments)    unknown  . Lovastatin Other (See Comments)    MUSCLE ACHES    Chief Complaint  Patient presents with  . Discharge Note    Discharge Visit     HPI:  76 y.o. female seen today at [facility] for discharge home. She was here for short term rehabilitation post hospital admission from 19th of January 2018-07/18/2016 post fall with traumatic closed nondisplaced fracture of neck of left femur. She underwent percutaneous screw fixation on 07/16/2016. She is seen in her room today with her son present at bedside.She denies any acute issues this visit. She states pain under control with current regimen. She has had unremarkable stay here in rehab.She has worked well with PT/OT now stable for discharge home.She will be discharged home with Home health PT/OT to continue with ROM, Exercise, Gait stability and muscle strengthening. She will  require DME FWW to allow her to maintain current level of independence with ADL's. Home  health services will be arranged by facility social worker prior to discharge.She will discharge with her medication from the facility. Prescription medication will be written x 1 month then patient to follow up with PCP in 1-2 weeks.Facility staff report no new concerns.      Past Medical History:  Diagnosis Date  . Atypical chest pain   . Childhood asthma   . Colon polyps   . Degenerative arthritis   . GERD (gastroesophageal reflux disease)   . Left knee injury    DISLOCATION  . 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 Vitamin B12 deficiency     Past Surgical History:  Procedure Laterality Date  . CARPAL TUNNEL RELEASE Right 1995  . COLONOSCOPY  2006   OCT  . DILATION AND CURETTAGE OF UTERUS  1981   1990  . HIP PINNING,CANNULATED Left 07/16/2016   Procedure: CANNULATED HIP PINNING;  Surgeon: Samson Frederic, MD;  Location: [facility] OR;  Service: Orthopedics;  Laterality: Left;  . TONSILLECTOMY AND ADENOIDECTOMY  1944  . VAGINAL HYSTERECTOMY  2003  . VOLAR GANGLION CYST     INJECTION      reports that she has never smoked. She has never used smokeless tobacco. She reports that she does not drink alcohol or use drugs. Social History   Social History  . Marital status: Married    Spouse name: N/A  . Number of children: 2  . Years of education: COLLEGE-1   Occupational History  . Retired   . CMA   . Lab [employer]    Social History Main  Topics  . Smoking status: Never Smoker  . Smokeless tobacco: Never Used  . Alcohol use No  . Drug use: No  . Sexual activity: Not on file   Other Topics Concern  . Not on file   Social History Narrative   Patient lives at home her son lives with her.   Retired.   Education CMA.    Right handed.   Caffeine Yes.    Allergies  Allergen Reactions  . Anesthetics, Halogenated Other (See Comments)    unknown  . Lovastatin Other (See Comments)    MUSCLE ACHES    Pertinent  Health Maintenance Due  Topic Date Due  . DEXA SCAN  07/11/2004  . PNA vac  Low Risk Adult (1 of 2 - PCV13) 07/11/2004  . INFLUENZA VACCINE  01/26/2016    Medications: Allergies as of 07/29/2016      Reactions   Anesthetics, Halogenated Other (See Comments)   unknown   Lovastatin Other (See Comments)   MUSCLE ACHES      Medication List       Accurate as of 07/29/16  5:39 PM. Always use your most recent med list.          acetaminophen 500 MG tablet Commonly known as:  TYLENOL Take 500-1,000 mg by mouth every 6 (six) hours as needed for mild pain or moderate pain.   albuterol 108 (90 Base) MCG/ACT inhaler Commonly known as:  PROVENTIL HFA;VENTOLIN HFA Inhale 2 puffs into the lungs 3 (three) times daily as needed for wheezing or shortness of breath.   buPROPion 150 MG 24 hr tablet Commonly known as:  WELLBUTRIN XL Take 150 mg by mouth daily.   celecoxib 200 MG capsule Commonly known as:  CELEBREX Take 200 mg by mouth 2 (two) times daily as needed for mild pain.   donepezil 10 MG tablet Commonly known as:  ARICEPT Take 1 tablet (10 mg total) by mouth at bedtime.   menthol-cetylpyridinium 3 MG lozenge Commonly known as:  CEPACOL Take 1 lozenge (3 mg total) by mouth as needed for sore throat (sore throat).   methocarbamol 500 MG tablet Commonly known as:  ROBAXIN Take 1 tablet (500 mg total) by mouth every 8 (eight) hours as needed for muscle spasms.   polyethylene glycol packet Commonly known as:  MIRALAX / GLYCOLAX Take 17 g by mouth daily.   senna-docusate 8.6-50 MG tablet Commonly known as:  Senokot-S Take 1 tablet by mouth at bedtime as needed for mild constipation.   UNABLE TO FIND Med Name: Med pass 120 mL by mouth 2 times daily   VITAMIN B-12 PO Take 1 tablet by mouth daily.   Vitamin D (Ergocalciferol) 50000 units Caps capsule Commonly known as:  DRISDOL Take 50,000 Units by mouth every 7 (seven) days.       Review of Systems  Constitutional: Negative for activity change, appetite change, chills, fatigue and fever.    HENT: Negative for congestion, rhinorrhea, sinus pain, sinus pressure, sneezing and sore throat.   Eyes: Negative.   Respiratory: Negative for cough, chest tightness, shortness of breath and wheezing.   Cardiovascular: Negative for chest pain, palpitations and leg swelling.  Gastrointestinal: Negative for abdominal distention, abdominal pain, constipation, diarrhea and nausea.  Endocrine: Negative.   Genitourinary: Negative for dysuria, flank pain, frequency and urgency.  Musculoskeletal: Positive for gait problem.  Skin: Negative for color change, pallor and rash.  Neurological: Negative for dizziness, seizures, syncope, light-headedness and headaches.  Hematological: Does not bruise/bleed  easily.  Psychiatric/Behavioral: Negative for agitation, confusion, hallucinations and sleep disturbance. The patient is not nervous/anxious.     Vitals:   07/29/16 1006  BP: 132/72  Pulse: 75  Resp: 18  Temp: 97.6 F (36.4 C)  TempSrc: Oral  SpO2: 97%  Weight: 103 lb 12.8 oz (47.1 kg)  Height: 4\' 10"  (1.473 m)   Body mass index is 21.69 kg/m. Physical Exam  Constitutional: She appears well-developed and well-nourished. No distress.  HENT:  Head: Normocephalic.  Mouth/Throat: Oropharynx is clear and moist. No oropharyngeal exudate.  Eyes: Conjunctivae and EOM are normal. Pupils are equal, round, and reactive to light. Right eye exhibits no discharge. Left eye exhibits no discharge. No scleral icterus.  Neck: Normal range of motion. No JVD present. No thyromegaly present.  Cardiovascular: Normal rate, regular rhythm, normal heart sounds and intact distal pulses.  Exam reveals no gallop and no friction rub.   No murmur heard. Pulmonary/Chest: Effort normal and breath sounds normal. No respiratory distress. She has no wheezes. She has no rales.  Abdominal: Soft. Bowel sounds are normal. She exhibits no distension. There is no tenderness. There is no rebound and no guarding.  Musculoskeletal:  She exhibits no edema, tenderness or deformity.  Moves x 4 extremities except unsteady gait.   Lymphadenopathy:    She has no cervical adenopathy.  Neurological: She is alert.  Skin: Skin is warm and dry. No rash noted. No erythema. No pallor.  Psychiatric: She has a normal mood and affect.    Labs reviewed: Basic Metabolic Panel:  Recent Labs  16/10/96 0409 07/17/16 0543 07/18/16 0553 07/25/16  NA 138 139 140 144  K 3.4* 3.5 3.6 3.9  CL 105 108 110  --   CO2 23 24 25   --   GLUCOSE 102* 90 102*  --   BUN 8 7 5* 10  CREATININE 0.68 0.78 0.65 0.7  CALCIUM 9.0 8.1* 8.1*  --    CBC:  Recent Labs  07/15/16 0513 07/16/16 0409 07/17/16 0543 07/18/16 0553 07/25/16  WBC 12.3* 7.0 5.2 4.8 5.3  NEUTROABS 10.9*  --   --   --   --   HGB 13.2 12.7 10.6* 11.1* 11.4*  HCT 40.0 38.7 32.7* 34.0* 35*  MCV 93.5 92.8 92.9 92.9  --   PLT 266 259 220 236 367    Assessment/Plan:   1. Unsteady gait Has worked well with PT/ OT. Will discharge home PT/OT to continue with ROM, Exercise, Gait stability and muscle strengthening. She will require  DME FWW to allow her to maintain current level of independence with ADL's. Fall and safety precautions.   2. Intermittent asthma without complication, unspecified asthma severity Stable. Continue her albuterol three times daily as needed for wheezing or shortness of breath.   3. Dementia without behavioral disturbance, unspecified dementia type No new behavioral issues reported. Continue to Aricept.   4. Depression, unspecified depression type Stable. Continue on Bupropion. Monitor for mood changes.   5. Slow transit constipation Current regimen effective. Continue to encourage oral intake and hydration.   6. Nondisplaced fracture of neck of femur (HCC) Status post short term rehabilitation post hospital admission from 19th of January 2018-07/18/2016 post fall with traumatic closed nondisplaced fracture of neck of left femur. She underwent  percutaneous screw fixation on 07/16/2016.continue on Celebrex 200 mg capsule and Tylenol PRN for pain. Continue Robaxin for muscle spasm. Follow up with Ortho as directed. CBC, BMP in 1-2 weeks with PCP   Patient is  being discharged with the following home health services:    - PT/OT to continue with ROM, Exercise, Gait stability and muscle strengthening.   Patient is being discharged with the following durable medical equipment:    -  FWW to allow her to maintain current level of independence with ADL's.  Patient has been advised to f/u with their PCP in 1-2 weeks to for a transitions of care visit.  Social services at their facility was responsible for arranging this appointment.  Pt was provided with adequate prescriptions of noncontrolled medications to reach the scheduled appointment .  For controlled substances, a limited supply was provided as appropriate for the individual patient.  If the pt normally receives these medications from a pain clinic or has a contract with another physician, these medications should be received from that clinic or physician only).    Future labs/tests needed:  CBC, BMP in 1-2 weeks with PCP

## 2016-09-14 ENCOUNTER — Encounter (INDEPENDENT_AMBULATORY_CARE_PROVIDER_SITE_OTHER): Payer: Self-pay

## 2016-09-14 ENCOUNTER — Ambulatory Visit (INDEPENDENT_AMBULATORY_CARE_PROVIDER_SITE_OTHER): Payer: Medicare Other | Admitting: Psychiatry

## 2016-09-14 ENCOUNTER — Encounter (HOSPITAL_COMMUNITY): Payer: Self-pay | Admitting: Psychiatry

## 2016-09-14 VITALS — BP 130/72 | HR 81 | Ht <= 58 in | Wt 100.0 lb

## 2016-09-14 DIAGNOSIS — F341 Dysthymic disorder: Secondary | ICD-10-CM | POA: Diagnosis not present

## 2016-09-14 NOTE — Progress Notes (Signed)
Psychiatric Initial Adult Assessment   Patient Identification: Deborah Moreno MRN:  [id number] Date of Evaluation:  09/14/2016 Referral Source: Dr. Sheryn Bison Chief Complaint:   This patient is a 76 year old widow whose mother is referred here for evaluation for psychiatric complication from diagnosis dementia. The patient lives with her son,Deborah Moreno who was seen in today's evaluation. [place]. The patient been a widow for 3 years. It was her second husband died. Her first husband she had to children with Deborah Moreno and [name].Deborah Moreno is married and has 2 children all of whom are doing well. At this time the patient denies significant stressors. Financially she is stable. She's had no recent deaths. She is a retired Lawyer, been retired for 5 years. Generally her health is actually very good. She is diagnosed approximately 4 years ago with a dementing process probably that also. Her neurologist at bedtime began her on Aricept and she's been on it since. At various points along the way providers thought that she might have some depression. In close evaluation the patient as well as her son there is no clear evidence of a major depressive disorder. Further she's tried 3 different antidepressants none of which she says that her. Recently she came off Wellbutrin. She denies any difference. At this time the patient denies being depressed. She's not suspicious. She is not somatic. She is not agitated. She is sleeping and eating well. She's got good energy. She claims she concentrates well. She denies worthlessness. She denies being suicidal now or ever as described before. She has no psychiatric history at all. She's never been psychiatrically hospitalized, seen a psychiatrist or been in therapy. The patient enjoys watching TV reading the paper back. She likes yardwork which she'll get back to when he gets warm. The patient denies the use of alcohol. She demonstrates no evidence of  psychosis. She's never had an episode of mania. He denies symptoms of generalized anxiety disorder, panic disorder or obsessive-compulsive disorder. The patient still drives and doesn't have problems long she doesn't go far. She's not had any accidents and she doesn't get lost. She does most of her bills with some assistance from her son. Her son helps her with some of the medications but the most part she seems to him very well. He only really changed over the years she stopped cooking in a significant way. Her son says she does not asked questions over and over. Overall the patient says she's doing well. Chief Complaint    Memory Loss; Depression     Visit Diagnosis: No diagnosis found.  History of Present Illness:    Associated Signs/Symptoms: Depression Symptoms:   (Hypo) Manic Symptoms:   Anxiety Symptoms:   Psychotic Symptoms:   PTSD Symptoms:   Past Psychiatric History: 3 different antidepressants without benefit  Previous Psychotropic Medications: Yes   Substance Abuse History in the last 12 months:  No.  Consequences of Substance Abuse:   Past Medical History:  Past Medical History:  Diagnosis Date  . Atypical chest pain   . Childhood asthma   . Colon polyps   . Degenerative arthritis   . GERD (gastroesophageal reflux disease)   . Left knee injury    DISLOCATION  . 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 Vitamin B12 deficiency     Past Surgical History:  Procedure Laterality Date  . CARPAL TUNNEL RELEASE Right 1995  . COLONOSCOPY  2006   OCT  . DILATION AND CURETTAGE OF UTERUS  1981   1990  . HIP PINNING,CANNULATED Left 07/16/2016   Procedure: CANNULATED HIP PINNING;  Surgeon: Samson Frederic, MD;  Location: [facility] OR;  Service: Orthopedics;  Laterality: Left;  . TONSILLECTOMY AND ADENOIDECTOMY  1944  . VAGINAL HYSTERECTOMY  2003  . VOLAR GANGLION CYST     INJECTION    Family Psychiatric History:   Family History:  Family History  Problem Relation Age of Onset  .  Breast cancer Mother   . Parkinsonism Father   . Alzheimer's disease Father   . Dementia Father   . Alcohol abuse Paternal Uncle     Social History:   Social History   Social History  . Marital status: Married    Spouse name: N/A  . Number of children: 2  . Years of education: COLLEGE-1   Occupational History  . Retired   . CMA   . Lab [employer]    Social History Main Topics  . Smoking status: Never Smoker  . Smokeless tobacco: Never Used  . Alcohol use No  . Drug use: No  . Sexual activity: Not Currently   Other Topics Concern  . None   Social History Narrative   Patient lives at home her son lives with her.   Retired.   Education CMA.    Right handed.   Caffeine Yes.    Additional Social History:   Allergies:   Allergies  Allergen Reactions  . Anesthetics, Halogenated Other (See Comments)    unknown  . Lovastatin Other (See Comments)    MUSCLE ACHES    Metabolic Disorder Labs: No results found for: HGBA1C, MPG No results found for: PROLACTIN No results found for: CHOL, TRIG, HDL, CHOLHDL, VLDL, LDLCALC   Current Medications: Current Outpatient Prescriptions  Medication Sig Dispense Refill  . acetaminophen (TYLENOL) 500 MG tablet Take 500-1,000 mg by mouth every 6 (six) hours as needed for mild pain or moderate pain.    Marland Kitchen albuterol (PROVENTIL HFA;VENTOLIN HFA) 108 (90 BASE) MCG/ACT inhaler Inhale 2 puffs into the lungs 3 (three) times daily as needed for wheezing or shortness of breath.    . celecoxib (CELEBREX) 200 MG capsule Take 200 mg by mouth 2 (two) times daily as needed for mild pain.     . Cyanocobalamin (VITAMIN B-12 PO) Take 1 tablet by mouth daily.     Marland Kitchen donepezil (ARICEPT) 10 MG tablet Take 1 tablet (10 mg total) by mouth at bedtime. 30 tablet 6  . Vitamin D, Ergocalciferol, (DRISDOL) 50000 units CAPS capsule Take 50,000 Units by mouth every 7 (seven) days.    Marland Kitchen buPROPion (WELLBUTRIN XL) 150 MG 24 hr tablet Take 150 mg by mouth daily.     Marland Kitchen  menthol-cetylpyridinium (CEPACOL) 3 MG lozenge Take 1 lozenge (3 mg total) by mouth as needed for sore throat (sore throat). (Patient not taking: Reported on 09/14/2016) 100 tablet 0  . methocarbamol (ROBAXIN) 500 MG tablet Take 1 tablet (500 mg total) by mouth every 8 (eight) hours as needed for muscle spasms. (Patient not taking: Reported on 09/14/2016) 21 tablet 0  . polyethylene glycol (MIRALAX / GLYCOLAX) packet Take 17 g by mouth daily. (Patient not taking: Reported on 09/14/2016) 14 each 0  . senna-docusate (SENOKOT-S) 8.6-50 MG tablet Take 1 tablet by mouth at bedtime as needed for mild constipation. (Patient not taking: Reported on 09/14/2016) 30 tablet 0   No current facility-administered medications for this visit.     Neurologic: Headache: No Seizure: No Paresthesias:No  Musculoskeletal: Strength &  Muscle Tone: within normal limits Gait & Station: normal Patient leans: N/A  Psychiatric Specialty Exam: ROS  Blood pressure 130/72, pulse 81, height 4' 9.5" (1.461 m), weight 100 lb (45.4 kg).Body mass index is 21.27 kg/m.  General Appearance: Fairly Groomed  Eye Contact:  Good  Speech:  Clear and Coherent  Volume:  Normal  Mood:  Euthymic  Affect:  Appropriate  Thought Process:  Goal Directed  Orientation:  Full (Time, Place, and Person)  Thought Content:  WDL  Suicidal Thoughts:  No  Homicidal Thoughts:  No  Memory:  Negative  Judgement:Good     Psychomotor Activity:  Normal  Concentration:    Recall:  Good  Fund of Knowledge:Good  Language: Good  Akathisia:  No  Handed:  Right  AIMS (if indicated):    Assets:  Desire for Improvement  ADL's:  Intact  Cognition: WNL  Sleep:      Treatment Plan Summary: At this time in my opinion this patient demonstrates no clear evidence of major depression. She demonstrates no evidence of psychosis or agitation or any other complications from his dementing process. What could be suspect of whether or not she has a dementing  process given the fact that over the last 3 years she showed little change. The other hand it is true that she's been on Aricept 10 mg every years. I strongly recommended that they stay on Aricept time being at this time I do not think an antidepressant would be beneficial. Today the patient shared that she does not feel lonely or bored. One would think [place]  doeshe might feel isolated. Today we talked about avoiding loneliness. I shared with her that the biggest problems arise. She agreed. He said things that could be considered to do to increase her activities. They feel too far away from his senior center. Her looking into a church that might be nearby but the patient herself brought up the idea of possibly volunteering somewhere. We talked about some of those possibilities. Nonetheless at this time the patient agreed to return to see me in 6 months where we will reevaluate her emotional status. At this time she will not return to taking Wellbutrin. The patient certainly is not suicidal.    Lucas MallowGerald Irving Kejuan Bekker, MD 3/21/20182:08 PM

## 2016-09-30 ENCOUNTER — Other Ambulatory Visit: Payer: Self-pay | Admitting: Neurology

## 2016-10-01 ENCOUNTER — Other Ambulatory Visit: Payer: Self-pay | Admitting: Neurology

## 2016-10-05 ENCOUNTER — Ambulatory Visit (INDEPENDENT_AMBULATORY_CARE_PROVIDER_SITE_OTHER): Payer: Medicare Other | Admitting: Adult Health

## 2016-10-05 ENCOUNTER — Encounter: Payer: Self-pay | Admitting: Adult Health

## 2016-10-05 VITALS — BP 140/81 | HR 65 | Wt 101.2 lb

## 2016-10-05 DIAGNOSIS — R413 Other amnesia: Secondary | ICD-10-CM | POA: Diagnosis not present

## 2016-10-05 MED ORDER — MEMANTINE HCL 28 X 5 MG & 21 X 10 MG PO TABS: ORAL_TABLET | ORAL | 0 refills | Status: DC

## 2016-10-05 NOTE — Progress Notes (Signed)
I have read the note, and I agree with the clinical assessment and plan.  Mataeo Ingwersen KEITH   

## 2016-10-05 NOTE — Patient Instructions (Addendum)
Memory score slightly declined Continue Aricept Start Namenda titration pack. At then beginning of week four of the titration pack please call for maintenance prescription  If your symptoms worsen or you develop new symptoms please let us know.   Memantine Tablets What is this medicine? MEMANTINE (MEM an teen) is used to treat dementia caused by Alzheimer's disease. This medicine may be used for other purposes; ask your health care provider or pharmacist if you have questions. COMMON BRAND NAME(S): Namenda What should I tell my health care provider before I take this medicine? They need to know if you have any of these conditions: -difficulty passing urine -kidney disease -liver disease -seizures -an unusual or allergic reaction to memantine, other medicines, foods, dyes, or preservatives -pregnant or trying to get pregnant -breast-feeding How should I use this medicine? Take this medicine by mouth with a glass of water. Follow the directions on the prescription label. You may take this medicine with or without food. Take your doses at regular intervals. Do not take your medicine more often than directed. Continue to take your medicine even if you feel better. Do not stop taking except on the advice of your doctor or health care professional. Talk to your pediatrician regarding the use of this medicine in children. Special care may be needed. Overdosage: If you think you have taken too much of this medicine contact a poison control center or emergency room at once. NOTE: This medicine is only for you. Do not share this medicine with others. What if I miss a dose? If you miss a dose, take it as soon as you can. If it is almost time for your next dose, take only that dose. Do not take double or extra doses. If you do not take your medicine for several days, contact your health care provider. Your dose may need to be changed. What may interact with this  medicine? -acetazolamide -amantadine -cimetidine -dextromethorphan -dofetilide -hydrochlorothiazide -ketamine -metformin -methazolamide -quinidine -ranitidine -sodium bicarbonate -triamterene This list may not describe all possible interactions. Give your health care provider a list of all the medicines, herbs, non-prescription drugs, or dietary supplements you use. Also tell them if you smoke, drink alcohol, or use illegal drugs. Some items may interact with your medicine. What should I watch for while using this medicine? Visit your doctor or health care professional for regular checks on your progress. Check with your doctor or health care professional if there is no improvement in your symptoms or if they get worse. You may get drowsy or dizzy. Do not drive, use machinery, or do anything that needs mental alertness until you know how this drug affects you. Do not stand or sit up quickly, especially if you are an older patient. This reduces the risk of dizzy or fainting spells. Alcohol can make you more drowsy and dizzy. Avoid alcoholic drinks. What side effects may I notice from receiving this medicine? Side effects that you should report to your doctor or health care professional as soon as possible: -allergic reactions like skin rash, itching or hives, swelling of the face, lips, or tongue -agitation or a feeling of restlessness -depressed mood -dizziness -hallucinations -redness, blistering, peeling or loosening of the skin, including inside the mouth -seizures -vomiting Side effects that usually do not require medical attention (report to your doctor or health care professional if they continue or are bothersome): -constipation -diarrhea -headache -nausea -trouble sleeping This list may not describe all possible side effects. Call your doctor for medical advice  about side effects. You may report side effects to FDA at 1-800-FDA-1088. Where should I keep my medicine? Keep  out of the reach of children. Store at room temperature between 15 degrees and 30 degrees C (59 degrees and 86 degrees F). Throw away any unused medicine after the expiration date. NOTE: This sheet is a summary. It may not cover all possible information. If you have questions about this medicine, talk to your doctor, pharmacist, or health care provider.  2018 Elsevier/Gold Standard (2013-04-01 14:10:42)

## 2016-10-05 NOTE — Progress Notes (Signed)
PATIENT: Deborah Moreno DOB: December 09, 1940  REASON FOR VISIT: follow up- Memory HISTORY FROM: patient  HISTORY OF PRESENT ILLNESS: [name] is a 76 year old female with a history of mild memory disturbance. She returns today for follow-up. She is currently taking Aricept 10 mg and tolerating it well. She denies any significant changes with her memory. She is currently living with her son. She is able to complete all ADLs independently. She does have supervision with her finances and medications. Denies any trouble sleeping. Denies hallucinations. Good appetite. Does not feel that she's had to give up anything due to her memory. She does operate a motor vehicle without difficulty. Denies any changes in her mood or behavior. No agitation or aggressiveness. She returns today for an evaluation.  HISTORY  07/07/14: Deborah Moreno is a 76 year old right-handed white female with a history of mild memory disturbance. The patient has been followed through this office over the last 2 years, she has had very little change in her memory over that period of time. The patient is on Aricept taking 10 mg daily, she is tolerating the medication well. She returns to this office for an evaluation. She lives with her son, she operates motor vehicle without difficulty. She requires some assistance with the finances and keeping up with her medications. Overall, she has not altered her activities of daily living over the last 2 years because of any memory changes. She is able to maintain her weight on the Aricept. She returns for an evaluation. She indicates that she is sleeping well at night.   REVIEW OF SYSTEMS: Out of a complete 14 system review of symptoms, the patient complains only of the following symptoms, and all other reviewed systems are negative.  Joint pain  ALLERGIES: Allergies  Allergen Reactions  . Anesthetics, Halogenated Other (See Comments)    unknown  . Lovastatin Other (See Comments)    MUSCLE  ACHES    HOME MEDICATIONS: Outpatient Medications Prior to Visit  Medication Sig Dispense Refill  . acetaminophen (TYLENOL) 500 MG tablet Take 500-1,000 mg by mouth every 6 (six) hours as needed for mild pain or moderate pain.    Marland Kitchen albuterol (PROVENTIL HFA;VENTOLIN HFA) 108 (90 BASE) MCG/ACT inhaler Inhale 2 puffs into the lungs 3 (three) times daily as needed for wheezing or shortness of breath.    Marland Kitchen buPROPion (WELLBUTRIN XL) 150 MG 24 hr tablet Take 150 mg by mouth daily.     . celecoxib (CELEBREX) 200 MG capsule Take 200 mg by mouth 2 (two) times daily as needed for mild pain.     . Cyanocobalamin (VITAMIN B-12 PO) Take 1 tablet by mouth daily.     Marland Kitchen donepezil (ARICEPT) 10 MG tablet TAKE 1 TABLET BY MOUTH AT BEDTIME 30 tablet 6  . menthol-cetylpyridinium (CEPACOL) 3 MG lozenge Take 1 lozenge (3 mg total) by mouth as needed for sore throat (sore throat). (Patient not taking: Reported on 09/14/2016) 100 tablet 0  . methocarbamol (ROBAXIN) 500 MG tablet Take 1 tablet (500 mg total) by mouth every 8 (eight) hours as needed for muscle spasms. (Patient not taking: Reported on 09/14/2016) 21 tablet 0  . polyethylene glycol (MIRALAX / GLYCOLAX) packet Take 17 g by mouth daily. (Patient not taking: Reported on 09/14/2016) 14 each 0  . senna-docusate (SENOKOT-S) 8.6-50 MG tablet Take 1 tablet by mouth at bedtime as needed for mild constipation. (Patient not taking: Reported on 09/14/2016) 30 tablet 0  . Vitamin D, Ergocalciferol, (DRISDOL) 50000 units CAPS  capsule Take 50,000 Units by mouth every 7 (seven) days.     No facility-administered medications prior to visit.     PAST MEDICAL HISTORY: Past Medical History:  Diagnosis Date  . Atypical chest pain   . Childhood asthma   . Colon polyps   . Degenerative arthritis   . GERD (gastroesophageal reflux disease)   . Left knee injury    DISLOCATION  . 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 Vitamin B12 deficiency     PAST SURGICAL HISTORY: Past Surgical  History:  Procedure Laterality Date  . CARPAL TUNNEL RELEASE Right 1995  . COLONOSCOPY  2006   OCT  . DILATION AND CURETTAGE OF UTERUS  1981   1990  . HIP PINNING,CANNULATED Left 07/16/2016   Procedure: CANNULATED HIP PINNING;  Surgeon: Samson Frederic, MD;  Location: [facility] OR;  Service: Orthopedics;  Laterality: Left;  . TONSILLECTOMY AND ADENOIDECTOMY  1944  . VAGINAL HYSTERECTOMY  2003  . VOLAR GANGLION CYST     INJECTION    FAMILY HISTORY: Family History  Problem Relation Age of Onset  . Breast cancer Mother   . Parkinsonism Father   . Alzheimer's disease Father   . Dementia Father   . Alcohol abuse Paternal Uncle     SOCIAL HISTORY: Social History   Social History  . Marital status: Married    Spouse name: N/A  . Number of children: 2  . Years of education: COLLEGE-1   Occupational History  . Retired   . CMA   . Lab [employer]    Social History Main Topics  . Smoking status: Never Smoker  . Smokeless tobacco: Never Used  . Alcohol use No  . Drug use: No  . Sexual activity: Not Currently   Other Topics Concern  . Not on file   Social History Narrative   Patient lives at home her son lives with her.   Retired.   Education CMA.    Right handed.   Caffeine Yes.      PHYSICAL EXAM  Vitals:   10/05/16 0829  BP: 140/81  Pulse: 65  Weight: 101 lb 3.2 oz (45.9 kg)   Body mass index is 21.52 kg/m.   MMSE - Mini Mental State Exam 10/05/2016 01/05/2016 07/08/2015  Orientation to time Orientation to Place Registration Attention/ Calculation Recall Language- name 2 objects Language- repeat Language- follow 3 step command Language- read & follow direction Write a sentence Copy design Total score Generalized: Well developed, in no acute distress   Neurological examination  Mentation: Alert oriented to time, place, history taking. Follows all commands speech and  language fluent Cranial nerve II-XII: Pupils were equal round reactive to light. Extraocular movements were full, visual field were full on confrontational test. Facial sensation and strength were normal. Uvula tongue midline. Head turning and shoulder shrug  were normal and symmetric. Motor: The motor testing reveals 5 over 5 strength of all 4 extremities. Good symmetric motor tone is noted throughout.  Sensory: Sensory testing is intact to soft touch on all 4 extremities. No evidence of extinction is noted.  Coordination: Cerebellar testing reveals good finger-nose-finger and heel-to-shin bilaterally.  Gait and station: Gait is normal. Tandem gait is normal.  Romberg is negative. No drift is seen.  Reflexes: Deep tendon reflexes are symmetric and normal bilaterally.   DIAGNOSTIC DATA (LABS, IMAGING, TESTING) - I reviewed patient records, labs, notes, testing and imaging myself where available.  Lab Results  Component Value Date   WBC 5.3 07/25/2016   HGB 11.4 (A) 07/25/2016   HCT 35 (A) 07/25/2016   MCV 92.9 07/18/2016   PLT 367 07/25/2016      Component Value Date/Time   NA 144 07/25/2016   K 3.9 07/25/2016   CL 110 07/18/2016 0553   CO2 25 07/18/2016 0553   GLUCOSE 102 (H) 07/18/2016 0553   BUN 10 07/25/2016   CREATININE 0.7 07/25/2016   CREATININE 0.65 07/18/2016 0553   CALCIUM 8.1 (L) 07/18/2016 0553   GFRNONAA >60 07/18/2016 0553   GFRAA >60 07/18/2016 0553    Lab Results  Component Value Date   VITAMINB12 484 01/02/2014   Lab Results  Component Value Date   TSH 2.140 01/02/2014      ASSESSMENT AND PLAN 76 y.o. year old female  has a past medical history of Atypical chest pain; Childhood asthma; Colon polyps; Degenerative arthritis; GERD (gastroesophageal reflux disease); Left knee injury; Memory disorder (01/02/2014); and Vitamin B12 deficiency. here with:  1. Memory disturbance  The patient's memory score has remained stable. She will continue on Aricept 10 mg  at bedtime. She is advised that if her symptoms worsen or she develops any new symptoms she should let us know. She will follow-up in 6 months or sooner if needed.  I spent 15 minutes with the patient 50% of this time was spent reviewing the patient's memory score.    [name] Penny, MSN, NP-C 10/05/2016, 8:08 AM [facility] [address] [place], [place] [address] [phone]

## 2016-10-14 ENCOUNTER — Other Ambulatory Visit: Payer: Self-pay | Admitting: Internal Medicine

## 2016-10-14 DIAGNOSIS — Z1231 Encounter for screening mammogram for malignant neoplasm of breast: Secondary | ICD-10-CM

## 2016-11-07 ENCOUNTER — Ambulatory Visit
Admission: RE | Admit: 2016-11-07 | Discharge: 2016-11-07 | Disposition: A | Discharge status: Home / Self Care | Payer: Medicare Other | Source: Ambulatory Visit | Attending: Internal Medicine | Admitting: Internal Medicine

## 2016-11-07 DIAGNOSIS — Z1231 Encounter for screening mammogram for malignant neoplasm of breast: Secondary | ICD-10-CM

## 2016-11-14 ENCOUNTER — Telehealth: Payer: Self-pay | Admitting: Neurology

## 2016-11-14 ENCOUNTER — Other Ambulatory Visit: Payer: Self-pay | Admitting: Adult Health

## 2016-11-14 MED ORDER — MEMANTINE HCL 10 MG PO TABS: 10.0000 mg | ORAL_TABLET | Freq: Two times a day (BID) | ORAL | 3 refills | Status: DC

## 2016-11-14 NOTE — Telephone Encounter (Signed)
Called and spoke to husband of pt.  Pt has tolerated the generic namenda titiration.  She will start maintenance of 10mg  po bid.

## 2016-11-14 NOTE — Addendum Note (Signed)
Addended byHermenia Fiscal: YOUNG, SANDRA on: 11/14/2016 01:18 PM   Modules accepted: Orders

## 2016-11-14 NOTE — Telephone Encounter (Signed)
Checked with phone staff, message sent to me in error. Nothing further needed.

## 2016-11-14 NOTE — Telephone Encounter (Signed)
Pt son calling for a refill of  memantine (NAMENDA TITRATION PAK) tablet pack   Asking it be called into  [id number], [place] [address]

## 2016-11-14 NOTE — Telephone Encounter (Signed)
error 

## 2016-11-25 NOTE — Addendum Note (Signed)
Addendum  created 11/25/16 0917 by Kelvyn Schunk D, MD   Sign clinical note    

## 2017-03-03 ENCOUNTER — Other Ambulatory Visit: Payer: Self-pay | Admitting: Neurology

## 2017-03-17 ENCOUNTER — Ambulatory Visit (INDEPENDENT_AMBULATORY_CARE_PROVIDER_SITE_OTHER): Payer: Medicare Other | Admitting: Psychiatry

## 2017-03-17 VITALS — BP 126/80 | HR 71 | Ht 60.0 in | Wt 110.4 lb

## 2017-03-17 DIAGNOSIS — F0280 Dementia in other diseases classified elsewhere without behavioral disturbance: Secondary | ICD-10-CM

## 2017-03-17 DIAGNOSIS — G300 Alzheimer's disease with early onset: Secondary | ICD-10-CM | POA: Diagnosis not present

## 2017-03-17 NOTE — Progress Notes (Signed)
Psychiatric Initial Adult Assessment   Patient Identification: Deborah Moreno MRN:  [id number] Date of Evaluation:  03/17/2017 Referral Source: Dr. Sheryn Bison Chief Complaint:   Visit Diagnosis: No diagnosis found.   Today the patient is seen with her daughter [name] and her son [name]. The patient is doing well. She seems happy. She does remember that she saw me about 6 months ago. The patient is disoriented to year but knows the month. She doesn't know what day the weekends. The patient is actually functioning extremely well. She does all her basic ADLs without a problem. She does all her institutional ADLs with assistance. Amazingly she still drives every day short distances to well-known locations like the grocery store. She lives with her son [name] and together they prepare sandwiches and other food. The patient denies being depressed. She sleeping and eating well. She's got good energy. She is a little bit of social contact. Her daughter [name] shares that she is changed over the last year to in that she is much less outgoing. She did spend a lot of time by herself. [name] sees this is a change and is concerned about it. I shared with her that as a geriatric psychiatrist and looking for complications of memory problems such as clinical depression. This patient is not somatic nor she suspicious. The patient denies any depression. I believe what the patient's experiencing is significant apathy related to her Alzheimer's dementia. Think it is being misinterpreted. Over the years doctors are treated her with 3 different antidepressants none of which have worked. The patient is never been agitated. She shows no evidence of psychosis. I think it is hard for her daughter to understand the patient is not depressed and that this evolution she is seeing is part of her memory disorder. I suggested to the I suggested to the daughter that she look into Alzheimer's anonymous 800 number and go to a support group. I  shared with the patient that she should be aware of all the activities or potentials for her. Her family is looking for day programs and other activities and they offer these things to the patient she refuses them at this time. As [name] puts it we can only bring of course to water but the horse has to drink water. I shared with the family that the horse has to be thirsty. I do not think this patient is in any distress. I think this is the natural course of Alzheimer's dementia. She sees Dr. Anne Hahn in about one week and at that time I asked them to ask him about some new research it is actually occurring in his office. They agreed to do that.   History of Present Illness:    Associated Signs/Symptoms: Depression Symptoms:   (Hypo) Manic Symptoms:   Anxiety Symptoms:   Psychotic Symptoms:   PTSD Symptoms:   Past Psychiatric History: 3 different antidepressants without benefit  Previous Psychotropic Medications: Yes   Substance Abuse History in the last 12 months:  No.  Consequences of Substance Abuse:   Past Medical History:  Past Medical History:  Diagnosis Date  . Atypical chest pain   . Childhood asthma   . Colon polyps   . Degenerative arthritis   . GERD (gastroesophageal reflux disease)   . Left knee injury    DISLOCATION  . 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 Vitamin B12 deficiency     Past Surgical History:  Procedure Laterality Date  . CARPAL TUNNEL RELEASE Right 1995  . COLONOSCOPY  2006   OCT  . DILATION AND CURETTAGE OF UTERUS  1981   1990  . HIP PINNING,CANNULATED Left 07/16/2016   Procedure: CANNULATED HIP PINNING;  Surgeon: Samson Frederic, MD;  Location: [facility] OR;  Service: Orthopedics;  Laterality: Left;  . TONSILLECTOMY AND ADENOIDECTOMY  1944  . VAGINAL HYSTERECTOMY  2003  . VOLAR GANGLION CYST     INJECTION    Family Psychiatric History:   Family History:  Family History  Problem Relation Age of Onset  . Breast cancer Mother   . Parkinsonism Father   .  Alzheimer's disease Father   . Dementia Father   . Alcohol abuse Paternal Uncle     Social History:   Social History   Social History  . Marital status: Married    Spouse name: N/A  . Number of children: 2  . Years of education: COLLEGE-1   Occupational History  . Retired   . CMA   . Lab [employer]    Social History Main Topics  . Smoking status: Never Smoker  . Smokeless tobacco: Never Used  . Alcohol use No  . Drug use: No  . Sexual activity: Not Currently   Other Topics Concern  . Not on file   Social History Narrative   Patient lives at home her son lives with her.   Retired.   Education CMA.    Right handed.   Caffeine Yes.    Additional Social History:   Allergies:   Allergies  Allergen Reactions  . Anesthetics, Halogenated Other (See Comments)    unknown  . Lovastatin Other (See Comments)    MUSCLE ACHES    Metabolic Disorder Labs: No results found for: HGBA1C, MPG No results found for: PROLACTIN No results found for: CHOL, TRIG, HDL, CHOLHDL, VLDL, LDLCALC   Current Medications: Current Outpatient Prescriptions  Medication Sig Dispense Refill  . celecoxib (CELEBREX) 200 MG capsule Take 200 mg by mouth 2 (two) times daily as needed for mild pain.     . Cyanocobalamin (VITAMIN B-12 PO) Take 1 tablet by mouth daily.     Marland Kitchen acetaminophen (TYLENOL) 500 MG tablet Take 500-1,000 mg by mouth every 6 (six) hours as needed for mild pain or moderate pain.    Marland Kitchen albuterol (PROVENTIL HFA;VENTOLIN HFA) 108 (90 BASE) MCG/ACT inhaler Inhale 2 puffs into the lungs 3 (three) times daily as needed for wheezing or shortness of breath.    Marland Kitchen buPROPion (WELLBUTRIN XL) 150 MG 24 hr tablet Take 150 mg by mouth daily.     Marland Kitchen donepezil (ARICEPT) 10 MG tablet TAKE 1 TABLET BY MOUTH AT BEDTIME 30 tablet 2  . memantine (NAMENDA) 10 MG tablet Take 1 tablet (10 mg total) by mouth 2 (two) times daily. 180 tablet 3  . menthol-cetylpyridinium (CEPACOL) 3 MG lozenge Take 1 lozenge (3 mg  total) by mouth as needed for sore throat (sore throat). 100 tablet 0  . methocarbamol (ROBAXIN) 500 MG tablet Take 1 tablet (500 mg total) by mouth every 8 (eight) hours as needed for muscle spasms. 21 tablet 0  . polyethylene glycol (MIRALAX / GLYCOLAX) packet Take 17 g by mouth daily. 14 each 0  . senna-docusate (SENOKOT-S) 8.6-50 MG tablet Take 1 tablet by mouth at bedtime as needed for mild constipation. 30 tablet 0  . Vitamin D, Ergocalciferol, (DRISDOL) 50000 units CAPS capsule Take 50,000 Units by mouth every 7 (seven) days.     No current facility-administered medications for this visit.  Neurologic: Headache: No Seizure: No Paresthesias:No  Musculoskeletal: Strength & Muscle Tone: within normal limits Gait & Station: normal Patient leans: N/A  Psychiatric Specialty Exam: ROS  Blood pressure 126/80, pulse 71, height 5' (1.524 m), weight 110 lb 6.4 oz (50.1 kg).Body mass index is 21.56 kg/m.  General Appearance: Fairly Groomed  Eye Contact:  Good  Speech:  Clear and Coherent  Volume:  Normal  Mood:  Euthymic  Affect:  Appropriate  Thought Process:  Goal Directed  Orientation:  Full (Time, Place, and Person)  Thought Content:  WDL  Suicidal Thoughts:  No  Homicidal Thoughts:  No  Memory:  Negative  Judgement:Good     Psychomotor Activity:  Normal  Concentration:    Recall:  Good  Fund of Knowledge:Good  Language: Good  Akathisia:  No  Handed:  Right  AIMS (if indicated):    Assets:  Desire for Improvement  ADL's:  Intact  Cognition: WNL  Sleep:      Treatment Plan Summary: At this time we will and her care with this patient. She'll continue getting her Aricept and Namenda from Dr. Anne Hahn her neurologist. She is an upcoming appointment with him in October. The patient and the family were told that they may return any time the changes occur and they think they need a reevaluation. The patient is not suicidal. She is functioning actually very well. She  denies any physical complaints of chest pain shortness of breath or any distinct neurological symptoms other than memory impairment  Gypsy Balsam, MD 9/21/201810:43 AM

## 2017-04-04 ENCOUNTER — Telehealth: Payer: Self-pay

## 2017-04-04 MED ORDER — DONEPEZIL HCL 10 MG PO TABS: 10.0000 mg | ORAL_TABLET | Freq: Every day | ORAL | 0 refills | Status: DC

## 2017-04-04 NOTE — Telephone Encounter (Signed)
Received request for 90 day supply of her Donepezil . New Rx sent to [facility] pharmacy.

## 2017-04-06 ENCOUNTER — Encounter: Payer: Self-pay | Admitting: Adult Health

## 2017-04-06 ENCOUNTER — Ambulatory Visit (INDEPENDENT_AMBULATORY_CARE_PROVIDER_SITE_OTHER): Payer: Medicare Other | Admitting: Adult Health

## 2017-04-06 VITALS — BP 110/80 | HR 72 | Wt 112.4 lb

## 2017-04-06 DIAGNOSIS — R413 Other amnesia: Secondary | ICD-10-CM | POA: Diagnosis not present

## 2017-04-06 NOTE — Progress Notes (Signed)
I have read the note, and I agree with the clinical assessment and plan.  WILLIS,CHARLES KEITH   

## 2017-04-06 NOTE — Progress Notes (Signed)
PATIENT: Deborah Moreno DOB: Nov 03, 1940  REASON FOR VISIT: follow up- memory disturbance HISTORY FROM: patient  HISTORY OF PRESENT ILLNESS: Today 04/06/17 Deborah Moreno is a 76 year old female with a history of mild memory disturbance. She returns today for follow-up. She is currently taking Aricept and Namenda. Reports that she is tolerating this well. She denies any significant changes in her memory. She is able to complete all ADLs. She operates a Librarian, academic. Denies any changes with her sleep. Denies any significant changes with her mood or behavior. Her son does report that they saw Deborah Moreno for depression however he did not place her on antidepressant but rather encouraged her to become more social. Patient denies any additional neurological symptoms. She returns today for an evaluation.  HISTORY 10/05/16: Deborah Moreno is a 76 year old female with a history of mild memory disturbance. She returns today for follow-up. She is currently taking Aricept 10 mg and tolerating it well. She denies any significant changes with her memory. She is currently living with her son. She is able to complete all ADLs independently. She does have supervision with her finances and medications. Denies any trouble sleeping. Denies hallucinations. Good appetite. Does not feel that she's had to give up anything due to her memory. She does operate a motor vehicle without difficulty. Denies any changes in her mood or behavior. No agitation or aggressiveness. She returns today for an evaluation.  REVIEW OF SYSTEMS: Out of a complete 14 system review of symptoms, the patient complains only of the following symptoms, and all other reviewed systems are negative.  See history of present illness  ALLERGIES: Allergies  Allergen Reactions  . Anesthetics, Halogenated Other (See Comments)    unknown  . Lovastatin Other (See Comments)    MUSCLE ACHES    HOME MEDICATIONS: Outpatient Medications Prior to Visit    Medication Sig Dispense Refill  . acetaminophen (TYLENOL) 500 MG tablet Take 500-1,000 mg by mouth every 6 (six) hours as needed for mild pain or moderate pain.    Marland Kitchen albuterol (PROVENTIL HFA;VENTOLIN HFA) 108 (90 BASE) MCG/ACT inhaler Inhale 2 puffs into the lungs 3 (three) times daily as needed for wheezing or shortness of breath.    . celecoxib (CELEBREX) 200 MG capsule Take 200 mg by mouth 2 (two) times daily as needed for mild pain.     . Cyanocobalamin (VITAMIN B-12 PO) Take 1 tablet by mouth daily.     Marland Kitchen donepezil (ARICEPT) 10 MG tablet Take 1 tablet (10 mg total) by mouth at bedtime. 90 tablet 0  . memantine (NAMENDA) 10 MG tablet Take 1 tablet (10 mg total) by mouth 2 (two) times daily. 180 tablet 3  . menthol-cetylpyridinium (CEPACOL) 3 MG lozenge Take 1 lozenge (3 mg total) by mouth as needed for sore throat (sore throat). 100 tablet 0  . methocarbamol (ROBAXIN) 500 MG tablet Take 1 tablet (500 mg total) by mouth every 8 (eight) hours as needed for muscle spasms. 21 tablet 0  . senna-docusate (SENOKOT-S) 8.6-50 MG tablet Take 1 tablet by mouth at bedtime as needed for mild constipation. 30 tablet 0  . Vitamin D, Ergocalciferol, (DRISDOL) 50000 units CAPS capsule Take 50,000 Units by mouth every 7 (seven) days.    Marland Kitchen buPROPion (WELLBUTRIN XL) 150 MG 24 hr tablet Take 150 mg by mouth daily.     . polyethylene glycol (MIRALAX / GLYCOLAX) packet Take 17 g by mouth daily. 14 each 0   No facility-administered medications prior to visit.  PAST MEDICAL HISTORY: Past Medical History:  Diagnosis Date  . Atypical chest pain   . Childhood asthma   . Colon polyps   . Degenerative arthritis   . GERD (gastroesophageal reflux disease)   . Left knee injury    DISLOCATION  . 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 Vitamin B12 deficiency     PAST SURGICAL HISTORY: Past Surgical History:  Procedure Laterality Date  . CARPAL TUNNEL RELEASE Right 1995  . COLONOSCOPY  2006   OCT  . DILATION AND  CURETTAGE OF UTERUS  1981   1990  . HIP PINNING,CANNULATED Left 07/16/2016   Procedure: CANNULATED HIP PINNING;  Surgeon: Samson Frederic, MD;  Location: [facility] OR;  Service: Orthopedics;  Laterality: Left;  . TONSILLECTOMY AND ADENOIDECTOMY  1944  . VAGINAL HYSTERECTOMY  2003  . VOLAR GANGLION CYST     INJECTION    FAMILY HISTORY: Family History  Problem Relation Age of Onset  . Breast cancer Mother   . Parkinsonism Father   . Alzheimer's disease Father   . Dementia Father   . Alcohol abuse Paternal Uncle     SOCIAL HISTORY: Social History   Social History  . Marital status: Widowed    Spouse name: N/A  . Number of children: 2  . Years of education: COLLEGE-1   Occupational History  . Retired   . CMA   . Lab [employer]    Social History Main Topics  . Smoking status: Never Smoker  . Smokeless tobacco: Never Used  . Alcohol use No  . Drug use: No  . Sexual activity: Not Currently   Other Topics Concern  . Not on file   Social History Narrative   Patient lives at home her son lives with her.   Retired.   Education CMA.    Right handed.   Caffeine Yes.      PHYSICAL EXAM  Vitals:   04/06/17 0838  BP: 110/80  Pulse: 72  Weight: 112 lb 6.4 oz (51 kg)   Body mass index is 21.95 kg/m. MMSE - Mini Mental State Exam 04/06/2017 10/05/2016 01/05/2016  Orientation to time Orientation to Place Registration Attention/ Calculation Recall Language- name 2 objects Language- repeat Language- follow 3 step command Language- read & follow direction Write a sentence Copy design Total score Generalized: Well developed, in no acute distress   Neurological examination  Mentation: Alert oriented to time, place, history taking. Follows all commands speech and language fluent Cranial nerve II-XII: Pupils were equal round reactive to light. Extraocular movements were full, visual field  were full on confrontational test. Facial sensation and strength were normal. Uvula tongue midline. Head turning and shoulder shrug  were normal and symmetric. Motor: The motor testing reveals 5 over 5 strength of all 4 extremities. Good symmetric motor tone is noted throughout.  Sensory: Sensory testing is intact to soft touch on all 4 extremities. No evidence of extinction is noted.  Coordination: Cerebellar testing reveals good finger-nose-finger and heel-to-shin bilaterally.  Gait and station: Gait is normal. Tandem gait is normal. Romberg is negative. No drift is seen.  Reflexes: Deep tendon reflexes are symmetric and normal bilaterally.   DIAGNOSTIC DATA (LABS, IMAGING, TESTING) - I reviewed  patient records, labs, notes, testing and imaging myself where available.  Lab Results  Component Value Date   WBC 5.3 07/25/2016   HGB 11.4 (A) 07/25/2016   HCT 35 (A) 07/25/2016   MCV 92.9 07/18/2016   PLT 367 07/25/2016      Component Value Date/Time   NA 144 07/25/2016   K 3.9 07/25/2016   CL 110 07/18/2016 0553   CO2 25 07/18/2016 0553   GLUCOSE 102 (H) 07/18/2016 0553   BUN 10 07/25/2016   CREATININE 0.7 07/25/2016   CREATININE 0.65 07/18/2016 0553   CALCIUM 8.1 (L) 07/18/2016 0553   GFRNONAA >60 07/18/2016 0553   GFRAA >60 07/18/2016 0553        ASSESSMENT AND PLAN 76 y.o. year old female  has a past medical history of Atypical chest pain; Childhood asthma; Colon polyps; Degenerative arthritis; GERD (gastroesophageal reflux disease); Left knee injury; Memory disorder (01/02/2014); and Vitamin B12 deficiency. here with:  1. Memory disturbance  The patient's memory has remained relatively stable. She will continue on Aricept and Namenda. I have advised that if her symptoms worsen or she develops new symptoms she should let us know. She will follow-up in 6 months or sooner if needed.  Patient informs me that she is trying to get things changed on her will -specifically her  name. She reports that she wants the name on the Will  to match what is on her driver's license. States that the attorney needs a letter stating that she is competent to request this change. At this time I do feel that she is competent to make this decision. A letter was provided.  I spent 15 minutes with the patient. 50% of this time was spent discussing clinic here      [name] Penny, MSN, NP-C 04/06/2017, 8:48 AM [facility] [address] [place], [place] [address] [phone]

## 2017-04-06 NOTE — Patient Instructions (Signed)
Your Plan:  Continue Aricept and Namenda Memory score is stable If your symptoms worsen or you develop new symptoms please let us know.    Thank you for coming to see us at [facility]. I hope we have been able to provide you high quality care today.  You may receive a patient satisfaction survey over the next few weeks. We would appreciate your feedback and comments so that we may continue to improve ourselves and the health of our patients.  

## 2017-05-20 IMAGING — CR DG HIP (WITH OR WITHOUT PELVIS) 1V PORT*L*
2 series · 2 of 2 positions shown · non-contrast
Comparison: None.

CLINICAL DATA: Status post left hip surgery

EXAM:
DG HIP (WITH OR WITHOUT PELVIS) 1V PORT LEFT

[AP]
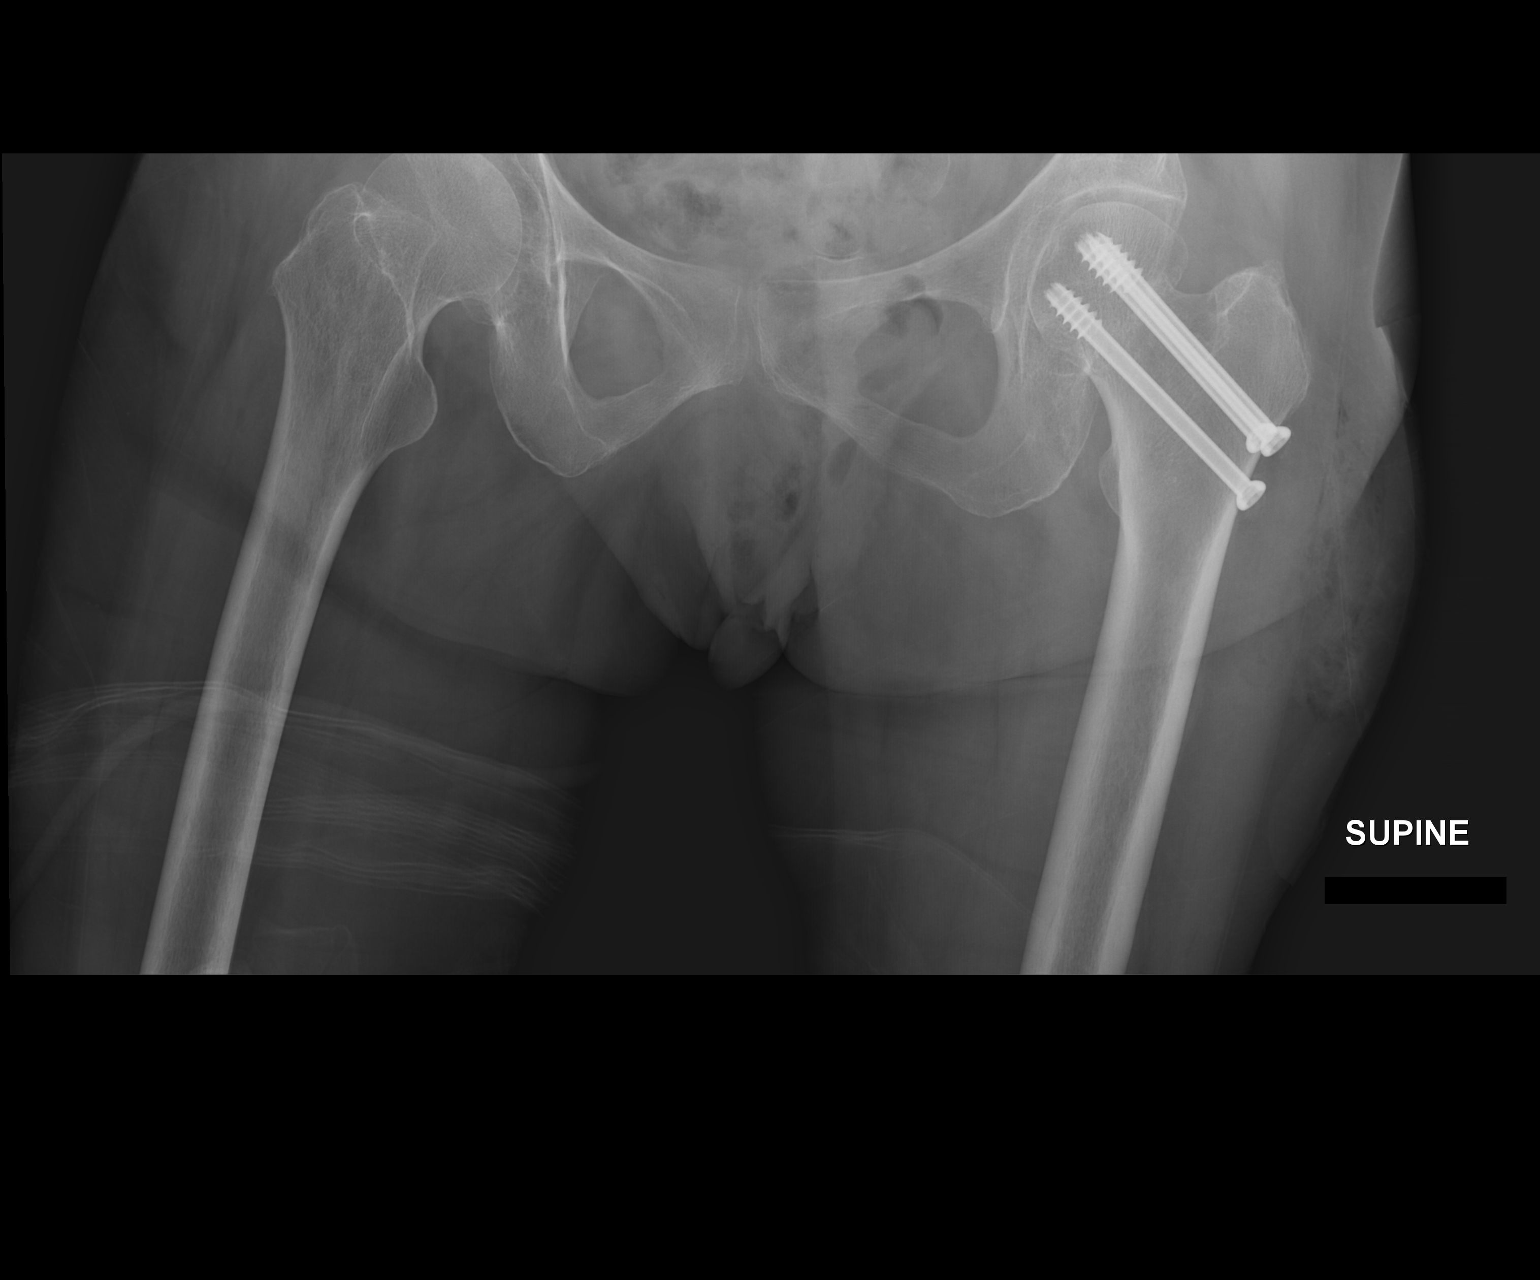

[xtable lateral]
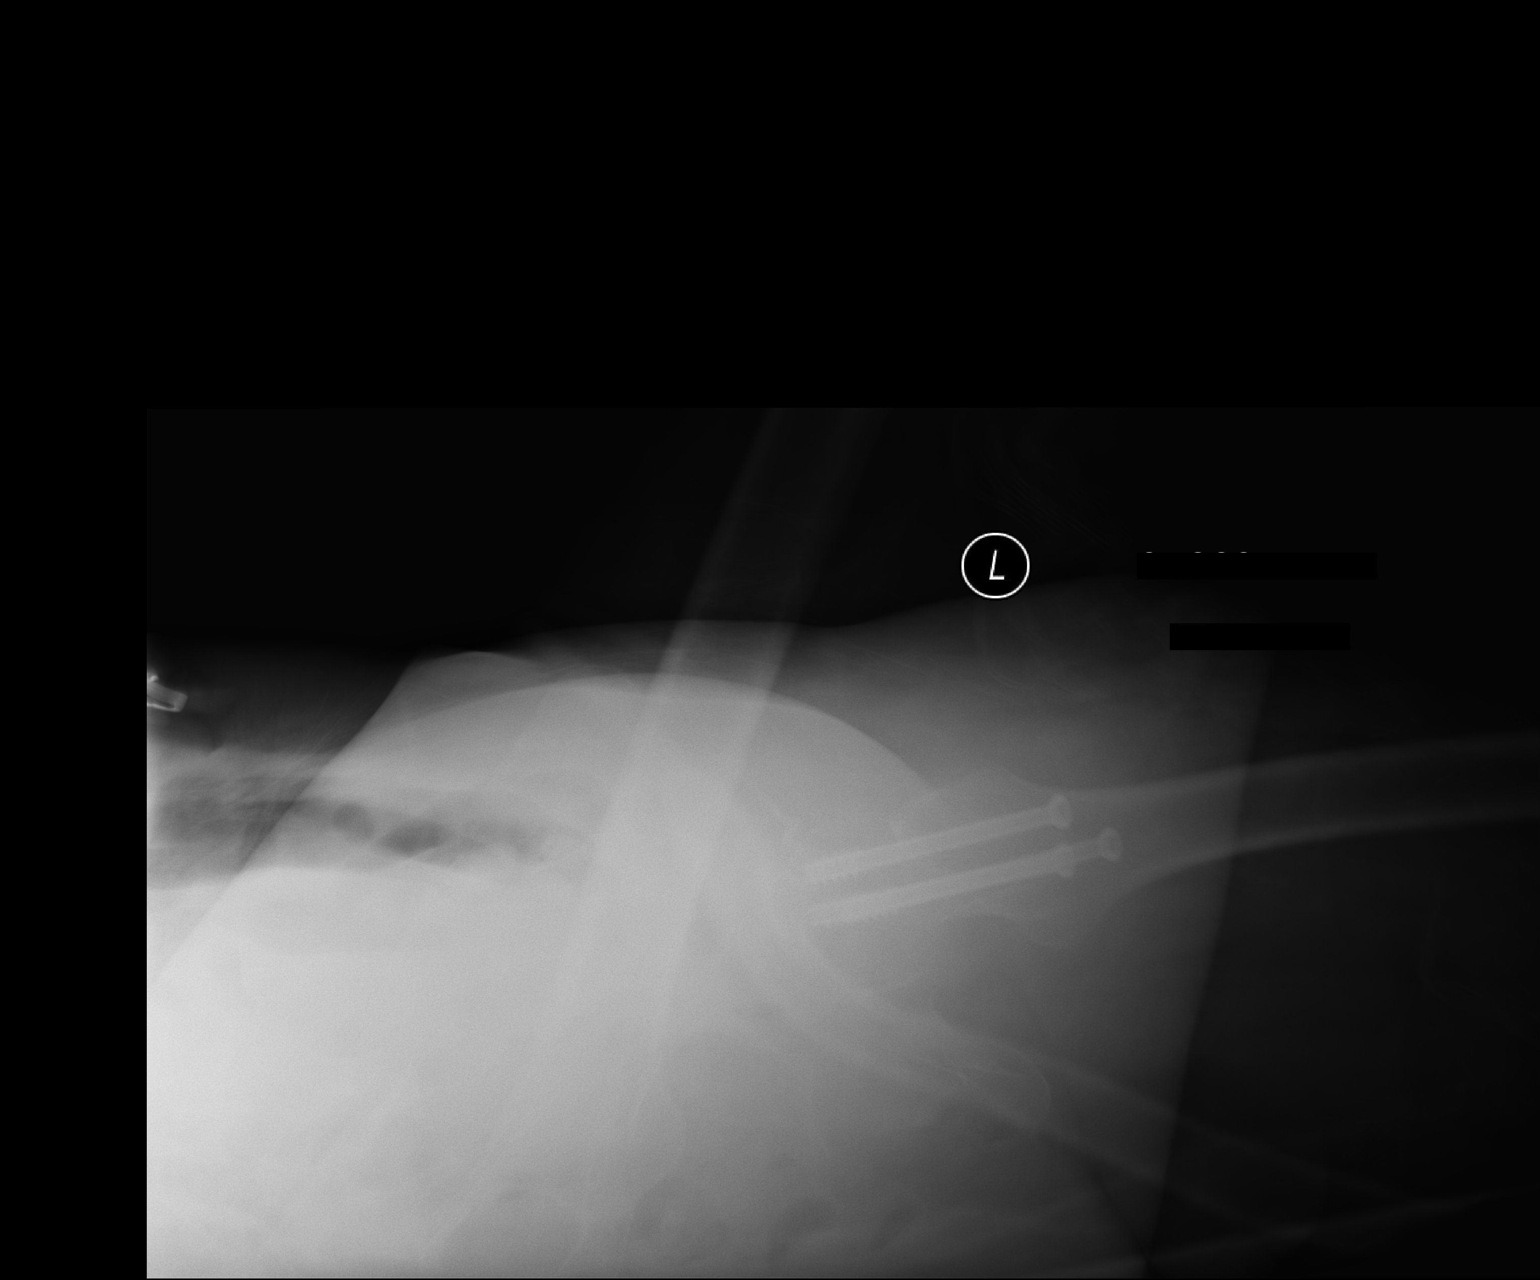

[2 of 2 positions shown; findings below may reference images not displayed]

FINDINGS: Left femoral neck fracture transfixed with 3 cannulated screws. No
failure or complication. Normal alignment.
IMPRESSION: Interval ORIF of a left femoral neck fracture.

## 2017-07-04 ENCOUNTER — Other Ambulatory Visit: Payer: Self-pay | Admitting: Adult Health

## 2017-10-05 ENCOUNTER — Ambulatory Visit: Payer: Medicare Other | Admitting: Adult Health

## 2017-10-05 ENCOUNTER — Encounter: Payer: Self-pay | Admitting: Adult Health

## 2017-10-05 ENCOUNTER — Telehealth: Payer: Self-pay | Admitting: *Deleted

## 2017-10-05 NOTE — Telephone Encounter (Signed)
Patient was no show for follow up with NP today.  

## 2017-10-17 ENCOUNTER — Other Ambulatory Visit: Payer: Self-pay | Admitting: Adult Health

## 2017-10-24 ENCOUNTER — Other Ambulatory Visit: Payer: Self-pay | Admitting: Internal Medicine

## 2017-10-24 DIAGNOSIS — Z1231 Encounter for screening mammogram for malignant neoplasm of breast: Secondary | ICD-10-CM

## 2017-11-17 ENCOUNTER — Ambulatory Visit
Admission: RE | Admit: 2017-11-17 | Discharge: 2017-11-17 | Disposition: A | Discharge status: Home / Self Care | Payer: Medicare Other | Source: Ambulatory Visit | Attending: Internal Medicine | Admitting: Internal Medicine

## 2017-11-17 DIAGNOSIS — Z1231 Encounter for screening mammogram for malignant neoplasm of breast: Secondary | ICD-10-CM

## 2018-01-04 ENCOUNTER — Other Ambulatory Visit: Payer: Self-pay | Admitting: Neurology

## 2018-02-06 ENCOUNTER — Encounter: Payer: Self-pay | Admitting: Adult Health

## 2018-02-06 ENCOUNTER — Ambulatory Visit: Payer: Medicare Other | Admitting: Adult Health

## 2018-02-06 VITALS — BP 132/82 | HR 78 | Ht <= 58 in | Wt 110.0 lb

## 2018-02-06 DIAGNOSIS — R413 Other amnesia: Secondary | ICD-10-CM

## 2018-02-06 NOTE — Patient Instructions (Addendum)
Your Plan:  Continue Aricept and Namenda Memory score is stable If your symptoms worsen or you develop new symptoms please let us know.    Thank you for coming to see us at [facility]. I hope we have been able to provide you high quality care today.  You may receive a patient satisfaction survey over the next few weeks. We would appreciate your feedback and comments so that we may continue to improve ourselves and the health of our patients.  

## 2018-02-06 NOTE — Progress Notes (Signed)
I have read the note, and I agree with the clinical assessment and plan.  Deborah Moreno   

## 2018-02-06 NOTE — Progress Notes (Signed)
PATIENT: Deborah Moreno DOB: 02-28-1941  REASON FOR VISIT: follow up HISTORY FROM: patient  HISTORY OF PRESENT ILLNESS: Today 02/06/18:  Deborah Moreno is a 77 year old female with a history of mild memory.  She returns today for follow-up.  The patient lives at home with her son.  She is able to complete all ADLs and.  She continues to operate a motor vehicle without any difficulty.  She reports good appetite.  She does not do much cooking.  She states she primarily uses a microwave.  Denies any trouble sleep.  Denies hallucinations.  HISTORY 04/06/17 Deborah Moreno is a 77 year old female with a history of mild memory disturbance. She returns today for follow-up. She is currently taking Aricept and Namenda. Reports that she is tolerating this well. She denies any significant changes in her memory. She is able to complete all ADLs. She operates a Librarian, academicmotor vehicle. Denies any changes with her sleep. Denies any significant changes with her mood or behavior. Her son does report that they saw Dr. Donell BeersPlovsky for depression however he did not place her on antidepressant but rather encouraged her to become more social. Patient denies any additional neurological symptoms. She returns today for an evaluation.   REVIEW OF SYSTEMS: Out of a complete 14 system review of symptoms, the patient complains only of the following symptoms, and all other reviewed systems are negative.  See HPI  ALLERGIES: Allergies  Allergen Reactions  . Anesthetics, Halogenated Other (See Comments)    unknown  . Lovastatin Other (See Comments)    MUSCLE ACHES    HOME MEDICATIONS: Outpatient Medications Prior to Visit  Medication Sig Dispense Refill  . acetaminophen (TYLENOL) 500 MG tablet Take 500-1,000 mg by mouth every 6 (six) hours as needed for mild pain or moderate pain.    Marland Kitchen. albuterol (PROVENTIL HFA;VENTOLIN HFA) 108 (90 BASE) MCG/ACT inhaler Inhale 2 puffs into the lungs 3 (three) times daily as needed for wheezing or  shortness of breath.    . celecoxib (CELEBREX) 200 MG capsule Take 200 mg by mouth 2 (two) times daily as needed for mild pain.     . Cyanocobalamin (VITAMIN B-12 PO) Take 1 tablet by mouth daily.     Marland Kitchen. donepezil (ARICEPT) 10 MG tablet TAKE 1 TABLET BY MOUTH EVERY EVENING AT BEDTIME 90 tablet 1  . memantine (NAMENDA) 10 MG tablet TAKE 1 TABLET BY MOUTH TWICE A DAY 180 tablet 1  . menthol-cetylpyridinium (CEPACOL) 3 MG lozenge Take 1 lozenge (3 mg total) by mouth as needed for sore throat (sore throat). 100 tablet 0  . raloxifene (EVISTA) 60 MG tablet Take 60 mg by mouth daily.  12  . RESTASIS 0.05 % ophthalmic emulsion INSTILL ONE DROP BY OPHTHALMIC ROUTE TWICE EVERY DAY  3  . Vitamin D, Ergocalciferol, (DRISDOL) 50000 units CAPS capsule Take 50,000 Units by mouth every 7 (seven) days.    . methocarbamol (ROBAXIN) 500 MG tablet Take 1 tablet (500 mg total) by mouth every 8 (eight) hours as needed for muscle spasms. 21 tablet 0  . senna-docusate (SENOKOT-S) 8.6-50 MG tablet Take 1 tablet by mouth at bedtime as needed for mild constipation. 30 tablet 0   No facility-administered medications prior to visit.     PAST MEDICAL HISTORY: Past Medical History:  Diagnosis Date  . Atypical chest pain   . Childhood asthma   . Colon polyps   . Degenerative arthritis   . GERD (gastroesophageal reflux disease)   . Left knee injury  DISLOCATION  . 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 Vitamin B12 deficiency     PAST SURGICAL HISTORY: Past Surgical History:  Procedure Laterality Date  . CARPAL TUNNEL RELEASE Right 1995  . COLONOSCOPY  2006   OCT  . DILATION AND CURETTAGE OF UTERUS  1981   1990  . HIP PINNING,CANNULATED Left 07/16/2016   Procedure: CANNULATED HIP PINNING;  Surgeon: Samson FredericBrian Swinteck, MD;  Location: [facility] OR;  Service: Orthopedics;  Laterality: Left;  . TONSILLECTOMY AND ADENOIDECTOMY  1944  . VAGINAL HYSTERECTOMY  2003  . VOLAR GANGLION CYST     INJECTION    FAMILY HISTORY: Family  History  Problem Relation Age of Onset  . Breast cancer Mother   . Parkinsonism Father   . Alzheimer's disease Father   . Dementia Father   . Alcohol abuse Paternal Uncle     SOCIAL HISTORY: Social History   Socioeconomic History  . Marital status: Widowed    Spouse name: Not on file  . Number of children: 2  . Years of education: COLLEGE-1  . Highest education level: Not on file  Occupational History  . Occupation: Retired  . Occupation: CMA  . Occupation: [employer]  Social Needs  . Financial resource strain: Not on file  . Food insecurity:    Worry: Not on file    Inability: Not on file  . Transportation needs:    Medical: Not on file    Non-medical: Not on file  Tobacco Use  . Smoking status: Never Smoker  . Smokeless tobacco: Never Used  Substance and Sexual Activity  . Alcohol use: No    Alcohol/week: 0.0 standard drinks  . Drug use: No  . Sexual activity: Not Currently  Lifestyle  . Physical activity:    Days per week: Not on file    Minutes per session: Not on file  . Stress: Not on file  Relationships  . Social connections:    Talks on phone: Not on file    Gets together: Not on file    Attends religious service: Not on file    Active member of club or organization: Not on file    Attends meetings of clubs or organizations: Not on file    Relationship status: Not on file  . Intimate partner violence:    Fear of current or ex partner: Not on file    Emotionally abused: Not on file    Physically abused: Not on file    Forced sexual activity: Not on file  Other Topics Concern  . Not on file  Social History Narrative   Patient lives at home her son lives with her.   Retired.   Education CMA.    Right handed.   Caffeine Yes.      PHYSICAL EXAM  Vitals:   02/06/18 1036  BP: 132/82  Pulse: 78  Weight: 110 lb (49.9 kg)  Height: 4\' 10"  (1.473 m)   Body mass index is 22.99 kg/m.   MMSE - Mini Mental State Exam 02/06/2018 04/06/2017 10/05/2016   Orientation to time 3 4 3   Orientation to Place 5 5 5   Registration 3 3 3   Attention/ Calculation 5 2 3   Recall 3 1 2   Language- name 2 objects 2 2 2   Language- repeat 1 1 1   Language- follow 3 step command 2 3 3   Language- read & follow direction 1 1 1   Write a sentence 1 1 1   Copy design 1 1 1   Total score  27 24 25      Generalized: Well developed, in no acute distress   Neurological examination  Mentation: Alert oriented to time, place, history taking. Follows all commands speech and language fluent Cranial nerve II-XII: Pupils were equal round reactive to light. Extraocular movements were full, visual field were full on confrontational test. Facial sensation and strength were normal. Uvula tongue midline. Head turning and shoulder shrug  were normal and symmetric. Motor: The motor testing reveals 5 over 5 strength of all 4 extremities. Good symmetric motor tone is noted throughout.  Sensory: Sensory testing is intact to soft touch on all 4 extremities. No evidence of extinction is noted.  Coordination: Cerebellar testing reveals good finger-nose-finger and heel-to-shin bilaterally.  Gait and station: Gait is normal.  Reflexes: Deep tendon reflexes are symmetric and normal bilaterally.   DIAGNOSTIC DATA (LABS, IMAGING, TESTING) - I reviewed patient records, labs, notes, testing and imaging myself where available.  Lab Results  Component Value Date   WBC 5.3 07/25/2016   HGB 11.4 (A) 07/25/2016   HCT 35 (A) 07/25/2016   MCV 92.9 07/18/2016   PLT 367 07/25/2016      Component Value Date/Time   NA 144 07/25/2016   K 3.9 07/25/2016   CL 110 07/18/2016 0553   CO2 25 07/18/2016 0553   GLUCOSE 102 (H) 07/18/2016 0553   BUN 10 07/25/2016   CREATININE 0.7 07/25/2016   CREATININE 0.65 07/18/2016 0553   CALCIUM 8.1 (L) 07/18/2016 0553   GFRNONAA >60 07/18/2016 0553   GFRAA >60 07/18/2016 0553   Lab Results  Component Value Date   VITAMINB12 484 01/02/2014   Lab Results   Component Value Date   TSH 2.140 01/02/2014      ASSESSMENT AND PLAN 77 y.o. year old female  has a past medical history of Atypical chest pain, Childhood asthma, Colon polyps, Degenerative arthritis, GERD (gastroesophageal reflux disease), Left knee injury, Memory disorder (01/02/2014), and Vitamin B12 deficiency. here with:  1.  Mild memory disturbance  The patient's memory score has remained stable.  She will continue on Aricept and Namenda.  I have advised that if her symptoms worsen or she develops new symptoms she should let us know.  She will follow-up in 6 months or sooner if needed.   I spent 15 minutes with the patient. 50% of this time was spent reviewing her memory:  [name] Penny, MSN, NP-C 02/06/2018, 10:52 AM [facility] [address], [address] [place], [place] [address] [phone]

## 2018-06-22 ENCOUNTER — Other Ambulatory Visit: Payer: Self-pay | Admitting: Adult Health

## 2018-07-09 ENCOUNTER — Other Ambulatory Visit: Payer: Self-pay | Admitting: Neurology

## 2018-08-20 ENCOUNTER — Encounter: Payer: Self-pay | Admitting: Neurology

## 2018-08-20 ENCOUNTER — Ambulatory Visit: Payer: Medicare Other | Admitting: Neurology

## 2018-08-20 VITALS — BP 134/64 | HR 57 | Wt 105.0 lb

## 2018-08-20 DIAGNOSIS — R413 Other amnesia: Secondary | ICD-10-CM | POA: Diagnosis not present

## 2018-08-20 NOTE — Progress Notes (Signed)
Reason for visit: Memory disturbance  Deborah Moreno is an 78 y.o. female  History of present illness:  Deborah Moreno is a 78 year old right-handed white female with a history of progressive memory disturbance.  The patient has been living with her son, she does require some assistance keeping up with medications and appointments, the son does the finances.  The patient does operate a motor vehicle, but she seemed to do quite well with this.  She does not cook much at this point.  She is sleeping fairly well at night, she has a good energy level during the day.  She has become somewhat more withdrawn, her family is concerned about this.  She was seen through psychiatry but was not felt to have depression.  The patient is on Aricept and Namenda, she tolerates the medications well.  Past Medical History:  Diagnosis Date  . Atypical chest pain   . Childhood asthma   . Colon polyps   . Degenerative arthritis   . GERD (gastroesophageal reflux disease)   . Left knee injury    DISLOCATION  . 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 Vitamin B12 deficiency     Past Surgical History:  Procedure Laterality Date  . CARPAL TUNNEL RELEASE Right 1995  . COLONOSCOPY  2006   OCT  . DILATION AND CURETTAGE OF UTERUS  1981   1990  . HIP PINNING,CANNULATED Left 07/16/2016   Procedure: CANNULATED HIP PINNING;  Surgeon: Samson Frederic, MD;  Location: [facility] OR;  Service: Orthopedics;  Laterality: Left;  . TONSILLECTOMY AND ADENOIDECTOMY  1944  . VAGINAL HYSTERECTOMY  2003  . VOLAR GANGLION CYST     INJECTION    Family History  Problem Relation Age of Onset  . Breast cancer Mother   . Parkinsonism Father   . Alzheimer's disease Father   . Dementia Father   . Alcohol abuse Paternal Uncle     Social history:  reports that she has never smoked. She has never used smokeless tobacco. She reports that she does not drink alcohol or use drugs.    Allergies  Allergen Reactions  . Anesthetics, Halogenated Other  (See Comments)    unknown  . Lovastatin Other (See Comments)    MUSCLE ACHES    Medications:  Prior to Admission medications   Medication Sig Start Date End Date Taking? Authorizing Provider  acetaminophen (TYLENOL) 500 MG tablet Take 500-1,000 mg by mouth every 6 (six) hours as needed for mild pain or moderate pain.   Yes [provider]  albuterol (PROVENTIL HFA;VENTOLIN HFA) 108 (90 BASE) MCG/ACT inhaler Inhale 2 puffs into the lungs 3 (three) times daily as needed for wheezing or shortness of breath.   Yes [provider]  celecoxib (CELEBREX) 200 MG capsule Take 200 mg by mouth 2 (two) times daily as needed for mild pain.    Yes [provider]  Cyanocobalamin (VITAMIN B-12 PO) Take 1 tablet by mouth daily.    Yes [provider]  donepezil (ARICEPT) 10 MG tablet TAKE 1 TABLET BY MOUTH EVERYDAY AT BEDTIME 07/09/18  Yes Millikan, Megan, NP  memantine (NAMENDA) 10 MG tablet TAKE 1 TABLET BY MOUTH TWICE A DAY 06/22/18  Yes Millikan, Megan, NP  menthol-cetylpyridinium (CEPACOL) 3 MG lozenge Take 1 lozenge (3 mg total) by mouth as needed for sore throat (sore throat). 07/18/16  Yes Rolly Salter, MD  raloxifene (EVISTA) 60 MG tablet Take 60 mg by mouth daily. 03/29/17  Yes [provider]  RESTASIS  0.05 % ophthalmic emulsion INSTILL ONE DROP BY OPHTHALMIC ROUTE TWICE EVERY DAY 01/04/18  Yes [provider]  Vitamin D, Ergocalciferol, (DRISDOL) 50000 units CAPS capsule Take 50,000 Units by mouth every 7 (seven) days.   Yes [provider]    ROS:  Out of a complete 14 system review of symptoms, the patient complains only of the following symptoms, and all other reviewed systems are negative.  Cough  Blood pressure 134/64, pulse (!) 57, weight 105 lb (47.6 kg), SpO2 97 %.  Physical Exam  General: The patient is alert and cooperative at the time of the examination.  Skin: No significant peripheral edema is  noted.   Neurologic Exam  Mental status: The patient is alert and oriented x 3 at the time of the examination. The Mini-Mental status examination done today shows a total score 26/30.   Cranial nerves: Facial symmetry is present. Speech is normal, no aphasia or dysarthria is noted. Extraocular movements are full. Visual fields are full.  Motor: The patient has good strength in all 4 extremities.  Sensory examination: Soft touch sensation is symmetric on the face, arms, and legs.  Coordination: The patient has good finger-nose-finger and heel-to-shin bilaterally.  Gait and station: The patient has a normal gait. Tandem gait is unsteady. Romberg is negative. No drift is seen.  Reflexes: Deep tendon reflexes are symmetric.   Assessment/Plan:  1.  Memory disturbance  The patient may be interested in research programs for memory disturbances.  The research coordinator will be contacted.  The patient will continue the Aricept and Namenda.  The patient will follow-up in 6 months.  She seems to be progressing relatively slowly with her memory.  Greater than 50% of the visit was spent in counseling and coordination of care.  Face-to-face time with the patient was 20 minutes.   Marlan Palau MD 08/20/2018 10:48 AM  [facility] [address] [place], [place] [address]  Phone [phone] Fax [phone]

## 2019-01-03 ENCOUNTER — Other Ambulatory Visit: Payer: Self-pay | Admitting: Adult Health

## 2019-01-13 ENCOUNTER — Other Ambulatory Visit: Payer: Self-pay | Admitting: Neurology

## 2019-02-17 NOTE — Progress Notes (Signed)
PATIENT: Deborah RoysLynda Moreno DOB: [birth year]  REASON FOR VISIT: follow up HISTORY FROM: patient  HISTORY OF PRESENT ILLNESS: Today 02/18/19  [name] is a 78 year old female with history of progressive memory disturbance.  She currently lives with her son.  She does operate a Librarian, academicmotor vehicle.  She remains on Aricept and Namenda.  Her last memory score was 26/30.  She is able to perform all of her ADLs.  Her son manages her meds, but she thinks she would be able to do it.  She denies any changes with her memory.  She indicates her walking is good, and denies any falls.  She does not do much cooking, mostly uses the microwave.  She reports that her appetite has been adequate. She says she sleeps well at night, but she also goes to bed late, because her son works second shift.  In regards to her driving, she has not had any accidents or episodes of getting lost.  She denies any new problems or concerns.  She enjoys reading and gardening. She was exercising prior to the COVID shut down of her gym.  She presents today for follow-up accompanied by her son, as well as her daughter who was on speaker phone.  HISTORY 08/20/2018 Dr. Anne Moreno: [name] is a 78 year old right-handed white female with a history of progressive memory disturbance.  The patient has been living with her son, she does require some assistance keeping up with medications and appointments, the son does the finances.  The patient does operate a motor vehicle, but she seemed to do quite well with this.  She does not cook much at this point.  She is sleeping fairly well at night, she has a good energy level during the day.  She has become somewhat more withdrawn, her family is concerned about this.  She was seen through psychiatry but was not felt to have depression.  The patient is on Aricept and Namenda, she tolerates the medications well.  REVIEW OF SYSTEMS: Out of a complete 14 system review of symptoms, the patient complains only of the  following symptoms, and all other reviewed systems are negative.  Memory loss  ALLERGIES: Allergies  Allergen Reactions  . Anesthetics, Halogenated Other (See Comments)    unknown  . Lovastatin Other (See Comments)    MUSCLE ACHES    HOME MEDICATIONS: Outpatient Medications Prior to Visit  Medication Sig Dispense Refill  . acetaminophen (TYLENOL) 500 MG tablet Take 500-1,000 mg by mouth every 6 (six) hours as needed for mild pain or moderate pain.    Marland Kitchen. albuterol (PROVENTIL HFA;VENTOLIN HFA) 108 (90 BASE) MCG/ACT inhaler Inhale 2 puffs into the lungs 3 (three) times daily as needed for wheezing or shortness of breath.    . celecoxib (CELEBREX) 200 MG capsule Take 200 mg by mouth 2 (two) times daily as needed for mild pain.     . Cyanocobalamin (VITAMIN B-12 PO) Take 1 tablet by mouth daily.     Marland Kitchen. donepezil (ARICEPT) 10 MG tablet TAKE 1 TABLET BY MOUTH EVERYDAY AT BEDTIME 90 tablet 1  . memantine (NAMENDA) 10 MG tablet TAKE 1 TABLET BY MOUTH TWICE A DAY 180 tablet 1  . raloxifene (EVISTA) 60 MG tablet Take 60 mg by mouth daily.  12  . RESTASIS 0.05 % ophthalmic emulsion INSTILL ONE DROP BY OPHTHALMIC ROUTE TWICE EVERY DAY  3  . Vitamin D, Ergocalciferol, (DRISDOL) 50000 units CAPS capsule Take 50,000 Units by mouth every 7 (seven) days.    .Marland Kitchen  menthol-cetylpyridinium (CEPACOL) 3 MG lozenge Take 1 lozenge (3 mg total) by mouth as needed for sore throat (sore throat). (Patient not taking: Reported on 02/18/2019) 100 tablet 0   No facility-administered medications prior to visit.     PAST MEDICAL HISTORY: Past Medical History:  Diagnosis Date  . Atypical chest pain   . Childhood asthma   . Colon polyps   . Degenerative arthritis   . GERD (gastroesophageal reflux disease)   . Left knee injury    DISLOCATION  . 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 Vitamin B12 deficiency     PAST SURGICAL HISTORY: Past Surgical History:  Procedure Laterality Date  . CARPAL TUNNEL RELEASE Right 1995  .  COLONOSCOPY  2006   OCT  . [place]  . HIP PINNING,CANNULATED Left 07/16/2016   Procedure: CANNULATED HIP PINNING;  Surgeon: Rod Can, MD;  Location: [place];  Service: Orthopedics;  Laterality: Left;  . TONSILLECTOMY AND ADENOIDECTOMY  1944  . VAGINAL HYSTERECTOMY  2003  . VOLAR GANGLION CYST     INJECTION    FAMILY HISTORY: Family History  Problem Relation Age of Onset  . Breast cancer Mother   . Parkinsonism Father   . Alzheimer's disease Father   . Dementia Father   . Alcohol abuse Paternal Uncle     SOCIAL HISTORY: Social History   Socioeconomic History  . Marital status: Widowed    Spouse name: Not on file  . Number of children: 2  . Years of education: COLLEGE-1  . Highest education level: Not on file  Occupational History  . Occupation: Retired  . Occupation: CMA  . Occupation: Lab [employer]  . Financial resource strain: Not on file  . Food insecurity    Worry: Not on file    Inability: Not on file  . Transportation needs    Medical: Not on file    Non-medical: Not on file  Tobacco Use  . Smoking status: Never Smoker  . Smokeless tobacco: Never Used  Substance and Sexual Activity  . Alcohol use: No    Alcohol/week: 0.0 standard drinks  . Drug use: No  . Sexual activity: Not Currently  Lifestyle  . Physical activity    Days per week: Not on file    Minutes per session: Not on file  . Stress: Not on file  Relationships  . Social Herbalist on phone: Not on file    Gets together: Not on file    Attends religious service: Not on file    Active member of club or organization: Not on file    Attends meetings of clubs or organizations: Not on file    Relationship status: Not on file  . Intimate partner violence    Fear of current or ex partner: Not on file    Emotionally abused: Not on file    Physically abused: Not on file    Forced sexual activity: Not on file  Other Topics Concern  .  Not on file  Social History Narrative   Patient lives at home her son lives with her.   Retired.   Education CMA.    Right handed.   Caffeine Yes.      PHYSICAL EXAM  Vitals:   02/18/19 1116  BP: (!) 150/78  Pulse: 71  Temp: 97.8 F (36.6 C)  TempSrc: Oral  Height: 4\' 10"  (1.473 m)   Body mass index is 21.95  kg/m.  Generalized: Well developed, in no acute distress  MMSE - Mini Mental State Exam 02/18/2019 08/20/2018 02/06/2018  Not completed: (No Data) - -  Orientation to time 2 1 3   Orientation to Place 3 5 5   Registration 3 3 3   Attention/ Calculation 1 5 5   Recall 2 3 3   Language- name 2 objects 2 2 2   Language- repeat 1 1 1   Language- follow 3 step command 2 3 2   Language- follow 3 step command-comments she held the paper in the air - -  Language- read & follow direction 1 1 1   Write a sentence 1 1 1   Copy design 1 1 1   Total score 19 26 27     Neurological examination  Mentation: Alert oriented to time, place, history taking. Follows all commands speech and language fluent Cranial nerve II-XII: Pupils were equal round reactive to light. Extraocular movements were full, visual field were full on confrontational test. Facial sensation and strength were normal. Head turning and shoulder shrug were normal and symmetric. Motor: The motor testing reveals 5 over 5 strength of all 4 extremities. Good symmetric motor tone is noted throughout.  Sensory: Sensory testing is intact to soft touch on all 4 extremities. No evidence of extinction is noted.  Coordination: Cerebellar testing reveals good finger-nose-finger and heel-to-shin bilaterally.  Gait and station: Gait is normal.  Reflexes: Deep tendon reflexes are symmetric and normal bilaterally.   DIAGNOSTIC DATA (LABS, IMAGING, TESTING) - I reviewed patient records, labs, notes, testing and imaging myself where available.  Lab Results  Component Value Date   WBC 5.3 07/25/2016   HGB 11.4 (A) 07/25/2016   HCT 35 (A)  07/25/2016   MCV 92.9 07/18/2016   PLT 367 07/25/2016      Component Value Date/Time   NA 144 07/25/2016   K 3.9 07/25/2016   CL 110 07/18/2016 0553   CO2 25 07/18/2016 0553   GLUCOSE 102 (H) 07/18/2016 0553   BUN 10 07/25/2016   CREATININE 0.7 07/25/2016   CREATININE 0.65 07/18/2016 0553   CALCIUM 8.1 (L) 07/18/2016 0553   GFRNONAA >60 07/18/2016 0553   GFRAA >60 07/18/2016 0553   No results found for: CHOL, HDL, LDLCALC, LDLDIRECT, TRIG, CHOLHDL No results found for: [id number] Lab Results  Component Value Date   VITAMINB12 484 01/02/2014   Lab Results  Component Value Date   TSH 2.140 01/02/2014    ASSESSMENT AND PLAN 78 y.o. year old female  has a past medical history of Atypical chest pain, Childhood asthma, Colon polyps, Degenerative arthritis, GERD (gastroesophageal reflux disease), Left knee injury, Memory disorder (01/02/2014), and Vitamin B12 deficiency. here with:  1. Memory disturbance   She has had some decline in her memory score of 19/30. She will continue Aricept and Namenda.  We will monitor her weight, since last visit she has lost 3 pounds. She weighs 101 lbs today.  We discussed the issue of driving, her driving should be closely scrutinized by her family.  She will follow-up in 6 months or sooner if needed.  I did advise that if her symptoms worsen or she develops any new symptoms she should let us know.  I spent 15 minutes with the patient. 50% of this time was spent discussing her plan of care.  Margie EgeSarah Slack, AGNP-C, DNP 02/18/2019, 12:37 PM [facility] [address], [address] [place], [place] [id number] [phone]

## 2019-02-18 ENCOUNTER — Other Ambulatory Visit: Payer: Self-pay

## 2019-02-18 ENCOUNTER — Encounter: Payer: Self-pay | Admitting: Neurology

## 2019-02-18 ENCOUNTER — Ambulatory Visit (INDEPENDENT_AMBULATORY_CARE_PROVIDER_SITE_OTHER): Payer: Medicare Other | Admitting: Neurology

## 2019-02-18 VITALS — BP 150/78 | HR 71 | Temp 97.8°F | Ht <= 58 in

## 2019-02-18 DIAGNOSIS — R413 Other amnesia: Secondary | ICD-10-CM

## 2019-02-18 NOTE — Patient Instructions (Signed)
Closely monitor driving Please continue Aricept and Namenda Follow-up in 6 months

## 2019-02-18 NOTE — Progress Notes (Signed)
I have read the note, and I agree with the clinical assessment and plan.  Marianna Cid K Joyelle Siedlecki   

## 2019-07-08 ENCOUNTER — Other Ambulatory Visit: Payer: Self-pay | Admitting: Neurology

## 2019-07-11 ENCOUNTER — Ambulatory Visit: Payer: Medicare Other | Attending: Internal Medicine

## 2019-07-11 DIAGNOSIS — Z23 Encounter for immunization: Secondary | ICD-10-CM

## 2019-07-11 NOTE — Progress Notes (Signed)
   Covid-19 Vaccination Clinic  Name:  Deborah Moreno    MRN: [id number] DOB: 08-03-1940  07/11/2019  [name] was observed post Covid-19 immunization for 15 minutes without incidence. She was provided with Vaccine Information Sheet and instruction to access the V-Safe system.   [name] was instructed to call 911 with any severe reactions post vaccine: Marland Kitchen Difficulty breathing  . Swelling of your face and throat  . A fast heartbeat  . A bad rash all over your body  . Dizziness and weakness    Immunizations Administered    Name Date Dose VIS Date Route   Pfizer COVID-19 Vaccine 07/11/2019 10:25 AM 0.3 mL 06/07/2019 Intramuscular   Manufacturer: [employer], [employer]   Lot: [id number]   NDC: [address]-6

## 2019-07-19 ENCOUNTER — Other Ambulatory Visit: Payer: Self-pay | Admitting: Neurology

## 2019-08-01 ENCOUNTER — Ambulatory Visit: Payer: Medicare Other | Attending: Internal Medicine

## 2019-08-01 DIAGNOSIS — Z23 Encounter for immunization: Secondary | ICD-10-CM | POA: Insufficient documentation

## 2019-08-01 NOTE — Progress Notes (Signed)
   Covid-19 Vaccination Clinic  Name:  Deborah Moreno    MRN: [id number] DOB: 12/26/1940  08/01/2019  Deborah Moreno was observed post Covid-19 immunization for 15 minutes without incidence. She was provided with Vaccine Information Sheet and instruction to access the V-Safe system.   Deborah Moreno was instructed to call 911 with any severe reactions post vaccine: Marland Kitchen Difficulty breathing  . Swelling of your face and throat  . A fast heartbeat  . A bad rash all over your body  . Dizziness and weakness    Immunizations Administered    Name Date Dose VIS Date Route   Pfizer COVID-19 Vaccine 08/01/2019 11:15 AM 0.3 mL 06/07/2019 Intramuscular   Manufacturer: [employer], [employer]   Lot: [id number]   NDC: [address]-4

## 2019-08-26 ENCOUNTER — Other Ambulatory Visit: Payer: Self-pay

## 2019-08-26 ENCOUNTER — Encounter: Payer: Self-pay | Admitting: Neurology

## 2019-08-26 ENCOUNTER — Ambulatory Visit: Payer: Medicare Other | Admitting: Neurology

## 2019-08-26 VITALS — BP 140/87 | HR 85 | Temp 97.7°F | Ht <= 58 in | Wt 103.0 lb

## 2019-08-26 DIAGNOSIS — R413 Other amnesia: Secondary | ICD-10-CM

## 2019-08-26 NOTE — Patient Instructions (Signed)
It was great to see you today! Memory score was 22/30 See you back in 6 months :)

## 2019-08-26 NOTE — Progress Notes (Signed)
I have read the note, and I agree with the clinical assessment and plan.  Deborah Moreno [name] [name]   

## 2019-08-26 NOTE — Progress Notes (Signed)
PATIENT: Deborah Moreno DOB: 1941/04/05  REASON FOR VISIT: follow up HISTORY FROM: patient  HISTORY OF PRESENT ILLNESS: Today 08/26/19  Deborah Moreno is an 79 year old female with history of progressive memory disturbance.  She lives with her son. She remains on Aricept and Namenda, she keeps up her medications.  She performs all her own ADLs.  She very rarely drives a car, has not had any issues thus far.  Her son works 2nd shift, she stays up until he gets home, goes to bed late, sleeps in.  Weight is staying about the same.  She denies any falls.  She said her appetite is good, does not do much cooking, uses microwave.  She enjoys reading.  She presents today for follow-up accompanied by her son, her daughter called in.  She feels her memory is staying about the same.  HISTORY 02/18/2019 SS: Deborah Moreno is a 79 year old female with history of progressive memory disturbance.  She currently lives with her son.  She does operate a Librarian, academic.  She remains on Aricept and Namenda.  Her last memory score was 26/30.  She is able to perform all of her ADLs.  Her son manages her meds, but she thinks she would be able to do it.  She denies any changes with her memory.  She indicates her walking is good, and denies any falls.  She does not do much cooking, mostly uses the microwave.  She reports that her appetite has been adequate. She says she sleeps well at night, but she also goes to bed late, because her son works second shift.  In regards to her driving, she has not had any accidents or episodes of getting lost.  She denies any new problems or concerns.  She enjoys reading and gardening. She was exercising prior to the COVID shut down of her gym.  She presents today for follow-up accompanied by her son, as well as her daughter who was on speaker phone.   REVIEW OF SYSTEMS: Out of a complete 14 system review of symptoms, the patient complains only of the following symptoms, and all other reviewed  systems are negative.  Memory loss  ALLERGIES: Allergies  Allergen Reactions  . Anesthetics, Halogenated Other (See Comments)    unknown  . Lovastatin Other (See Comments)    MUSCLE ACHES    HOME MEDICATIONS: Outpatient Medications Prior to Visit  Medication Sig Dispense Refill  . albuterol (PROVENTIL HFA;VENTOLIN HFA) 108 (90 BASE) MCG/ACT inhaler Inhale 2 puffs into the lungs 3 (three) times daily as needed for wheezing or shortness of breath.    Marland Kitchen amLODipine (NORVASC) 2.5 MG tablet Take 2.5 mg by mouth daily.    . celecoxib (CELEBREX) 200 MG capsule Take 200 mg by mouth 2 (two) times daily as needed for mild pain.     . Cyanocobalamin (VITAMIN B-12 PO) Take 1 tablet by mouth daily.     Marland Kitchen donepezil (ARICEPT) 10 MG tablet TAKE 1 TABLET BY MOUTH EVERYDAY AT BEDTIME 90 tablet 1  . memantine (NAMENDA) 10 MG tablet TAKE 1 TABLET BY MOUTH TWICE A DAY 180 tablet 1  . raloxifene (EVISTA) 60 MG tablet Take 60 mg by mouth daily.  12  . RESTASIS 0.05 % ophthalmic emulsion INSTILL ONE DROP BY OPHTHALMIC ROUTE TWICE EVERY DAY  3  . Vitamin D, Ergocalciferol, (DRISDOL) 50000 units CAPS capsule Take 50,000 Units by mouth every 7 (seven) days.    Marland Kitchen acetaminophen (TYLENOL) 500 MG tablet Take 500-1,000 mg  by mouth every 6 (six) hours as needed for mild pain or moderate pain.    Marland Kitchen menthol-cetylpyridinium (CEPACOL) 3 MG lozenge Take 1 lozenge (3 mg total) by mouth as needed for sore throat (sore throat). 100 tablet 0   No facility-administered medications prior to visit.    PAST MEDICAL HISTORY: Past Medical History:  Diagnosis Date  . Atypical chest pain   . Childhood asthma   . Colon polyps   . Degenerative arthritis   . GERD (gastroesophageal reflux disease)   . Left knee injury    DISLOCATION  . 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 Vitamin B12 deficiency     PAST SURGICAL HISTORY: Past Surgical History:  Procedure Laterality Date  . CARPAL TUNNEL RELEASE Right 1995  . COLONOSCOPY  2006    OCT  . DILATION AND CURETTAGE OF UTERUS  1981   1990  . HIP PINNING,CANNULATED Left 07/16/2016   Procedure: CANNULATED HIP PINNING;  Surgeon: Samson Frederic, MD;  Location: [facility] OR;  Service: Orthopedics;  Laterality: Left;  . TONSILLECTOMY AND ADENOIDECTOMY  1944  . VAGINAL HYSTERECTOMY  2003  . VOLAR GANGLION CYST     INJECTION    FAMILY HISTORY: Family History  Problem Relation Age of Onset  . Breast cancer Mother   . Parkinsonism Father   . Alzheimer's disease Father   . Dementia Father   . Alcohol abuse Paternal Uncle     SOCIAL HISTORY: Social History   Socioeconomic History  . Marital status: Widowed    Spouse name: Not on file  . Number of children: 2  . Years of education: COLLEGE-1  . Highest education level: Not on file  Occupational History  . Occupation: Retired  . Occupation: CMA  . Occupation: [employer]  . Smoking status: Never Smoker  . Smokeless tobacco: Never Used  Substance and Sexual Activity  . Alcohol use: No    Alcohol/week: 0.0 standard drinks  . Drug use: No  . Sexual activity: Not Currently  Other Topics Concern  . Not on file  Social History Narrative   Patient lives at home her son lives with her.   Retired.   Education CMA.    Right handed.   Caffeine Yes.   Social Determinants of Health   Financial Resource Strain:   . Difficulty of Paying Living Expenses: Not on file  Food Insecurity:   . Worried About Programme researcher, broadcasting/film/video in the Last Year: Not on file  . Ran Out of Food in the Last Year: Not on file  Transportation Needs:   . Lack of Transportation (Medical): Not on file  . Lack of Transportation (Non-Medical): Not on file  Physical Activity:   . Days of Exercise per Week: Not on file  . Minutes of Exercise per Session: Not on file  Stress:   . Feeling of Stress : Not on file  Social Connections:   . Frequency of Communication with Friends and Family: Not on file  . Frequency of Social Gatherings with Friends  and Family: Not on file  . Attends Religious Services: Not on file  . Active Member of Clubs or Organizations: Not on file  . Attends Banker Meetings: Not on file  . Marital Status: Not on file  Intimate Partner Violence:   . Fear of Current or Ex-Partner: Not on file  . Emotionally Abused: Not on file  . Physically Abused: Not on file  . Sexually Abused: Not on file  PHYSICAL EXAM  Vitals:   08/26/19 1038  BP: 140/87  Pulse: 85  Temp: 97.7 F (36.5 C)  SpO2: 98%  Weight: 103 lb (46.7 kg)  Height: 4\' 9"  (1.448 m)   Body mass index is 22.29 kg/m.  Generalized: Well developed, in no acute distress  MMSE - Mini Mental State Exam 08/26/2019 02/18/2019 08/20/2018  Not completed: - (No Data) -  Orientation to time 2 2 1   Orientation to Place 4 3 5   Registration 3 3 3   Attention/ Calculation 2 1 5   Recall 2 2 3   Language- name 2 objects 2 2 2   Language- repeat 1 1 1   Language- follow 3 step command 3 2 3   Language- follow 3 step command-comments - she held the paper in the air -  Language- read & follow direction 1 1 1   Write a sentence 1 1 1   Copy design 1 1 1   Total score 22 19 26     Neurological examination  Mentation: Alert oriented to time, place, history is equally provided by patient and son.  Follows all commands speech and language fluent Cranial nerve II-XII: Pupils were equal round reactive to light. Extraocular movements were full, visual field were full on confrontational test. Facial sensation and strength were normal. Head turning and shoulder shrug were normal and symmetric. Motor: Good strength of all extremities Sensory: Sensory testing is intact to soft touch on all 4 extremities. No evidence of extinction is noted.  Coordination: Cerebellar testing reveals good finger-nose-finger and heel-to-shin bilaterally.  Gait and station: Able to rise from seated position with pushoff, gait is somewhat cautious, but steady, shoes a little big.  No  assistive device. Reflexes: Deep tendon reflexes are symmetric  DIAGNOSTIC DATA (LABS, IMAGING, TESTING) - I reviewed patient records, labs, notes, testing and imaging myself where available.  Lab Results  Component Value Date   WBC 5.3 07/25/2016   HGB 11.4 (A) 07/25/2016   HCT 35 (A) 07/25/2016   MCV 92.9 07/18/2016   PLT 367 07/25/2016      Component Value Date/Time   NA 144 07/25/2016 0000   K 3.9 07/25/2016 0000   CL 110 07/18/2016 0553   CO2 25 07/18/2016 0553   GLUCOSE 102 (H) 07/18/2016 0553   BUN 10 07/25/2016 0000   CREATININE 0.7 07/25/2016 0000   CREATININE 0.65 07/18/2016 0553   CALCIUM 8.1 (L) 07/18/2016 0553   GFRNONAA >60 07/18/2016 0553   GFRAA >60 07/18/2016 0553   No results found for: CHOL, HDL, LDLCALC, LDLDIRECT, TRIG, CHOLHDL No results found for: HGBA1C Lab Results  Component Value Date   VITAMINB12 484 01/02/2014   Lab Results  Component Value Date   TSH 2.140 01/02/2014   ASSESSMENT AND PLAN 79 y.o. year old female  has a past medical history of Atypical chest pain, Childhood asthma, Colon polyps, Degenerative arthritis, GERD (gastroesophageal reflux disease), Left knee injury, Memory disorder (01/02/2014), and Vitamin B12 deficiency. here with:  1.  Memory disturbance  She has remained stable since last seen.  She will remain on Aricept and Namenda.  Her driving should be very closely monitored, at this time she rarely drives.  She wishes to continue to see our office on a regular basis.  She will follow-up in 6 months or sooner if needed.   I spent 15 minutes with the patient. 50% of this time was spent discussing her plan of care.   Butler Denmark, AGNP-C, DNP 08/26/2019, 11:58 AM [facility] [id number] [place], [place] [address] [phone]

## 2019-10-27 ENCOUNTER — Other Ambulatory Visit: Payer: Self-pay

## 2019-10-27 ENCOUNTER — Emergency Department (HOSPITAL_COMMUNITY): Payer: Medicare Other

## 2019-10-27 ENCOUNTER — Emergency Department (HOSPITAL_COMMUNITY)
Admission: EM | Admit: 2019-10-27 | Discharge: 2019-10-27 | Disposition: A | Discharge status: Home / Self Care | Payer: Medicare Other | Attending: Emergency Medicine | Admitting: Emergency Medicine

## 2019-10-27 ENCOUNTER — Encounter (HOSPITAL_COMMUNITY): Payer: Self-pay

## 2019-10-27 DIAGNOSIS — Y9389 Activity, other specified: Secondary | ICD-10-CM | POA: Diagnosis not present

## 2019-10-27 DIAGNOSIS — Z79899 Other long term (current) drug therapy: Secondary | ICD-10-CM | POA: Insufficient documentation

## 2019-10-27 DIAGNOSIS — Y929 Unspecified place or not applicable: Secondary | ICD-10-CM | POA: Insufficient documentation

## 2019-10-27 DIAGNOSIS — Y999 Unspecified external cause status: Secondary | ICD-10-CM | POA: Diagnosis not present

## 2019-10-27 DIAGNOSIS — S42201A Unspecified fracture of upper end of right humerus, initial encounter for closed fracture: Secondary | ICD-10-CM | POA: Insufficient documentation

## 2019-10-27 DIAGNOSIS — J45909 Unspecified asthma, uncomplicated: Secondary | ICD-10-CM | POA: Insufficient documentation

## 2019-10-27 DIAGNOSIS — W010XXA Fall on same level from slipping, tripping and stumbling without subsequent striking against object, initial encounter: Secondary | ICD-10-CM | POA: Diagnosis not present

## 2019-10-27 DIAGNOSIS — S4991XA Unspecified injury of right shoulder and upper arm, initial encounter: Secondary | ICD-10-CM | POA: Diagnosis present

## 2019-10-27 MED ORDER — OXYCODONE-ACETAMINOPHEN 5-325 MG PO TABS
0.5000 | ORAL_TABLET | Freq: Once | ORAL | Status: AC
  Administered 2019-10-27: 0.5 via ORAL
  Filled 2019-10-27: qty 1

## 2019-10-27 MED ORDER — ACETAMINOPHEN 500 MG PO TABS
1000.0000 mg | ORAL_TABLET | Freq: Once | ORAL | Status: AC
  Administered 2019-10-27: 1000 mg via ORAL
  Filled 2019-10-27: qty 2

## 2019-10-27 MED ORDER — MORPHINE SULFATE 15 MG PO TABS: 7.5000 mg | ORAL_TABLET | ORAL | 0 refills | Status: DC | PRN

## 2019-10-27 NOTE — ED Provider Notes (Signed)
[facility] [facility] DEPT Provider Note   CSN: [id number] Arrival date & time: 10/27/19  1526     History Chief Complaint  Patient presents with  . Shoulder Injury    Deborah Moreno is a 79 y.o. female.  79 yo F with a chief complaint of right shoulder pain.  Patient states that she was reaching for something and she lost her balance and fell onto her right shoulder.  She denies other area of injury.  Denies head injury denies loss consciousness denies neck pain back pain chest pain abdominal pain.  Denies lower extremity pain.  Denies pain to the right elbow or wrist or hand.  Denies prior issue with her right shoulder.  The history is provided by the patient.  Shoulder Injury This is a new problem. The current episode started less than 1 hour ago. The problem occurs constantly. The problem has not changed since onset.Pertinent negatives include no chest pain, no headaches and no shortness of breath. The symptoms are aggravated by bending and twisting. Nothing relieves the symptoms. She has tried nothing for the symptoms. The treatment provided no relief.       Past Medical History:  Diagnosis Date  . Atypical chest pain   . Childhood asthma   . Colon polyps   . Degenerative arthritis   . GERD (gastroesophageal reflux disease)   . Left knee injury    DISLOCATION  . 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 Vitamin B12 deficiency     Patient Active Problem List   Diagnosis Date Noted  . Unsteady gait 07/24/2016  . Left displaced femoral neck fracture ([place]) 07/16/2016  . Traumatic closed nondisplaced fracture of neck of left femur ([place]) 07/15/2016  . Intermittent asthma 07/15/2016  . Memory disorder 01/02/2014    Past Surgical History:  Procedure Laterality Date  . CARPAL TUNNEL RELEASE Right 1995  . COLONOSCOPY  2006   OCT  . [place]  . HIP PINNING,CANNULATED Left 07/16/2016   Procedure: CANNULATED HIP PINNING;  Surgeon:  Rod Can, MD;  Location: [facility];  Service: Orthopedics;  Laterality: Left;  . TONSILLECTOMY AND ADENOIDECTOMY  1944  . VAGINAL HYSTERECTOMY  2003  . VOLAR GANGLION CYST     INJECTION     OB History   No obstetric history on file.     Family History  Problem Relation Age of Onset  . Breast cancer Mother   . Parkinsonism Father   . Alzheimer's disease Father   . Dementia Father   . Alcohol abuse Paternal Uncle     Social History   Tobacco Use  . Smoking status: Never Smoker  . Smokeless tobacco: Never Used  Substance Use Topics  . Alcohol use: No    Alcohol/week: 0.0 standard drinks  . Drug use: No    Home Medications Prior to Admission medications   Medication Sig Start Date End Date Taking? Authorizing Provider  albuterol (PROVENTIL HFA;VENTOLIN HFA) 108 (90 BASE) MCG/ACT inhaler Inhale 2 puffs into the lungs 3 (three) times daily as needed for wheezing or shortness of breath.    [provider]  amLODipine (NORVASC) 2.5 MG tablet Take 2.5 mg by mouth daily. 06/12/19   [provider]  celecoxib (CELEBREX) 200 MG capsule Take 200 mg by mouth 2 (two) times daily as needed for mild pain.     [provider]  Cyanocobalamin (VITAMIN B-12 PO) Take 1 tablet by mouth daily.  [provider]  donepezil (ARICEPT) 10 MG tablet TAKE 1 TABLET BY MOUTH EVERYDAY AT BEDTIME 07/23/19   York Spaniel, MD  memantine (NAMENDA) 10 MG tablet TAKE 1 TABLET BY MOUTH TWICE A DAY 07/08/19   Glean Salvo, NP  morphine (MSIR) 15 MG tablet Take 0.5 tablets (7.5 mg total) by mouth every 4 (four) hours as needed for severe pain. 10/27/19   Melene Plan, DO  raloxifene (EVISTA) 60 MG tablet Take 60 mg by mouth daily. 03/29/17   [provider]  RESTASIS 0.05 % ophthalmic emulsion INSTILL ONE DROP BY OPHTHALMIC ROUTE TWICE EVERY DAY 01/04/18   [provider]  Vitamin D, Ergocalciferol, (DRISDOL) 50000 units CAPS capsule Take 50,000 Units by  mouth every 7 (seven) days.    [provider]    Allergies    Anesthetics, halogenated and Lovastatin  Review of Systems   Review of Systems  Constitutional: Negative for chills and fever.  HENT: Negative for congestion and rhinorrhea.   Eyes: Negative for redness and visual disturbance.  Respiratory: Negative for shortness of breath and wheezing.   Cardiovascular: Negative for chest pain and palpitations.  Gastrointestinal: Negative for nausea and vomiting.  Genitourinary: Negative for dysuria and urgency.  Musculoskeletal: Positive for arthralgias and myalgias.  Skin: Negative for pallor and wound.  Neurological: Negative for dizziness and headaches.    Physical Exam Updated Vital Signs BP 139/60 (BP Location: Left Arm)   Pulse (!) 57   Temp 97.7 F (36.5 C) (Oral)   Resp 18   Ht 5' (1.524 m)   Wt 49.9 kg   SpO2 99%   BMI 21.48 kg/m   Physical Exam Vitals and nursing note reviewed.  Constitutional:      General: She is not in acute distress.    Appearance: She is well-developed. She is not diaphoretic.  HENT:     Head: Normocephalic and atraumatic.  Eyes:     Pupils: Pupils are equal, round, and reactive to light.  Cardiovascular:     Rate and Rhythm: Normal rate and regular rhythm.     Heart sounds: No murmur. No friction rub. No gallop.   Pulmonary:     Effort: Pulmonary effort is normal.     Breath sounds: No wheezing or rales.  Abdominal:     General: There is no distension.     Palpations: Abdomen is soft.     Tenderness: There is no abdominal tenderness.  Musculoskeletal:        General: No tenderness.     Cervical back: Normal range of motion and neck supple.     Comments: Pain at the neck of the humerus.  No significant pain to the clavicle.  No pain along the scapula.  Range of motion of the elbow without tenderness.  Pulse motor and sensation are intact distally.  Palpated from head to toe without any other noted areas of bony tenderness.    Skin:    General: Skin is warm and dry.  Neurological:     Mental Status: She is alert and oriented to person, place, and time.  Psychiatric:        Behavior: Behavior normal.     ED Results / Procedures / Treatments   Labs (all labs ordered are listed, but only abnormal results are displayed) Labs Reviewed - No data to display  EKG None  Radiology DG Shoulder Right  Result Date: 10/27/2019 CLINICAL DATA:  Fall while getting out of shower with right  shoulder pain. EXAM: RIGHT SHOULDER - 2+ VIEW COMPARISON:  None. FINDINGS: Exam demonstrates a minimally displaced right humeral neck fracture. No evidence of dislocation. IMPRESSION: Displaced humeral neck fracture. Electronically Signed   By: Elberta Fortis M.D.   On: 10/27/2019 16:37    Procedures Procedures (including critical care time)  Medications Ordered in ED Medications  acetaminophen (TYLENOL) tablet 1,000 mg (1,000 mg Oral Given 10/27/19 1553)    ED Course  I have reviewed the triage vital signs and the nursing notes.  Pertinent labs & imaging results that were available during my care of the patient were reviewed by me and considered in my medical decision making (see chart for details).    MDM Rules/Calculators/A&P                      79 yo F with a chief complaint of right shoulder pain.  Nonsyncopal fall.  Will obtain a plain film of the shoulder.  Patient is a bit reluctant to get pain medicine.  Obtain imaging and reassess.  Plain film viewed by me with a proximal humerus fracture with displacement.  Discussed the results with the patient.  Will place in a sling.  Orthopedic follow-up.  5:13 PM:  I have discussed the diagnosis/risks/treatment options with the patient and family and believe the pt to be eligible for discharge home to follow-up with Ortho. We also discussed returning to the ED immediately if new or worsening sx occur. We discussed the sx which are most concerning (e.g., sudden worsening pain,  fever, inability to tolerate by mouth) that necessitate immediate return. Medications administered to the patient during their visit and any new prescriptions provided to the patient are listed below.  Medications given during this visit Medications  acetaminophen (TYLENOL) tablet 1,000 mg (1,000 mg Oral Given 10/27/19 1553)     The patient appears reasonably screen and/or stabilized for discharge and I doubt any other medical condition or other [facility] requiring further screening, evaluation, or treatment in the ED at this time prior to discharge.   Final Clinical Impression(s) / ED Diagnoses Final diagnoses:  Closed fracture of proximal end of right humerus, unspecified fracture morphology, initial encounter    Rx / DC Orders ED Discharge Orders         Ordered    morphine (MSIR) 15 MG tablet  Every 4 hours PRN     10/27/19 1710           Melene Plan, DO 10/27/19 1713

## 2019-10-27 NOTE — Discharge Instructions (Signed)
Take tylenol 1000mg(2 extra strength) four times a day.  ° °Then take the pain medicine if you feel like you need it. Narcotics do not help with the pain, they only make you care about it less.  You can become addicted to this, people may break into your house to steal it.  It will constipate you.  If you drive under the influence of this medicine you can get a DUI.   ° °

## 2019-10-27 NOTE — ED Triage Notes (Signed)
PT to ED with c/o of Right Shoulder pain. Pt was stepping into the shower and would up falling into wall. Right shoulder swelling and pain noted to area. Pt refused IV and pain meds from [facility].

## 2019-10-27 NOTE — ED Notes (Signed)
Ortho tech called waiting on to come place foam sling

## 2019-10-31 ENCOUNTER — Other Ambulatory Visit (HOSPITAL_COMMUNITY)
Admission: RE | Admit: 2019-10-31 | Discharge: 2019-10-31 | Disposition: A | Discharge status: Home / Self Care | Payer: Medicare Other | Source: Ambulatory Visit | Attending: Orthopedic Surgery | Admitting: Orthopedic Surgery

## 2019-10-31 ENCOUNTER — Other Ambulatory Visit: Payer: Self-pay

## 2019-10-31 ENCOUNTER — Encounter (HOSPITAL_COMMUNITY): Payer: Self-pay | Admitting: Orthopedic Surgery

## 2019-10-31 DIAGNOSIS — Z20822 Contact with and (suspected) exposure to covid-19: Secondary | ICD-10-CM | POA: Insufficient documentation

## 2019-10-31 DIAGNOSIS — Z01812 Encounter for preprocedural laboratory examination: Secondary | ICD-10-CM | POA: Insufficient documentation

## 2019-10-31 LAB — SARS CORONAVIRUS 2 (TAT 6-24 HRS): SARS Coronavirus 2: NEGATIVE

## 2019-10-31 NOTE — Progress Notes (Signed)
Spoke with pt's son, [name] for pre-op call. DPR on file. Pt has dementia. He states pt does not have a cardiac history or Diabetes. Is treated for HTN.  Covid test scheduled for today. Quarantine instructions given to [employer] and he voiced understanding.  ERAS with Pre-surgery Ensure ordered by Dr. Rennis Chris. Bruce states they will stop here after pt has her Covid test done and pick up the drink. Instructed him to have pt not to eat food after midnight tonight, but she may have clear liquids until 7:00 AM Friday (clear liquids list given to pt). Instructed him to have pt drink the Pre-surgery Ensure between 6:45 AM and 7 AM. He voiced understanding.

## 2019-11-01 ENCOUNTER — Ambulatory Visit (HOSPITAL_COMMUNITY): Payer: Medicare Other | Admitting: Anesthesiology

## 2019-11-01 ENCOUNTER — Encounter (HOSPITAL_COMMUNITY)
Admission: RE | Disposition: A | Discharge status: Home / Self Care | Payer: Self-pay | Source: Home / Self Care | Attending: Orthopedic Surgery

## 2019-11-01 ENCOUNTER — Ambulatory Visit (HOSPITAL_COMMUNITY)
Admission: RE | Admit: 2019-11-01 | Discharge: 2019-11-02 | Disposition: A | Discharge status: Home / Self Care | Payer: Medicare Other | Attending: Orthopedic Surgery | Admitting: Orthopedic Surgery

## 2019-11-01 ENCOUNTER — Encounter (HOSPITAL_COMMUNITY): Payer: Self-pay | Admitting: Orthopedic Surgery

## 2019-11-01 DIAGNOSIS — S42221A 2-part displaced fracture of surgical neck of right humerus, initial encounter for closed fracture: Secondary | ICD-10-CM | POA: Insufficient documentation

## 2019-11-01 DIAGNOSIS — F329 Major depressive disorder, single episode, unspecified: Secondary | ICD-10-CM | POA: Diagnosis not present

## 2019-11-01 DIAGNOSIS — M1990 Unspecified osteoarthritis, unspecified site: Secondary | ICD-10-CM | POA: Insufficient documentation

## 2019-11-01 DIAGNOSIS — J449 Chronic obstructive pulmonary disease, unspecified: Secondary | ICD-10-CM | POA: Diagnosis not present

## 2019-11-01 DIAGNOSIS — W1830XA Fall on same level, unspecified, initial encounter: Secondary | ICD-10-CM | POA: Insufficient documentation

## 2019-11-01 DIAGNOSIS — I10 Essential (primary) hypertension: Secondary | ICD-10-CM | POA: Diagnosis not present

## 2019-11-01 DIAGNOSIS — Z96611 Presence of right artificial shoulder joint: Secondary | ICD-10-CM

## 2019-11-01 DIAGNOSIS — F0390 Unspecified dementia without behavioral disturbance: Secondary | ICD-10-CM | POA: Diagnosis not present

## 2019-11-01 DIAGNOSIS — Z79890 Hormone replacement therapy (postmenopausal): Secondary | ICD-10-CM | POA: Diagnosis not present

## 2019-11-01 DIAGNOSIS — K219 Gastro-esophageal reflux disease without esophagitis: Secondary | ICD-10-CM | POA: Insufficient documentation

## 2019-11-01 DIAGNOSIS — Z79899 Other long term (current) drug therapy: Secondary | ICD-10-CM | POA: Diagnosis not present

## 2019-11-01 DIAGNOSIS — F17210 Nicotine dependence, cigarettes, uncomplicated: Secondary | ICD-10-CM | POA: Insufficient documentation

## 2019-11-01 HISTORY — DX: Unspecified dementia, unspecified severity, without behavioral disturbance, psychotic disturbance, mood disturbance, and anxiety: F03.90

## 2019-11-01 HISTORY — DX: Essential (primary) hypertension: I10

## 2019-11-01 HISTORY — DX: Nausea with vomiting, unspecified: R11.2

## 2019-11-01 HISTORY — DX: Other specified postprocedural states: Z98.890

## 2019-11-01 HISTORY — DX: Depression, unspecified: F32.A

## 2019-11-01 HISTORY — PX: REVERSE SHOULDER ARTHROPLASTY: SHX5054

## 2019-11-01 LAB — BASIC METABOLIC PANEL
Anion gap: 12 (ref 5–15)
BUN: 12 mg/dL (ref 8–23)
CO2: 25 mmol/L (ref 22–32)
Calcium: 8.7 mg/dL — ABNORMAL LOW (ref 8.9–10.3)
Chloride: 102 mmol/L (ref 98–111)
Creatinine, Ser: 0.61 mg/dL (ref 0.44–1.00)
GFR calc Af Amer: >60 mL/min (ref >60)
GFR calc non Af Amer: >60 mL/min (ref >60)
Glucose, Bld: 77 mg/dL (ref 70–99)
Potassium: 2.8 mmol/L — ABNORMAL LOW (ref 3.5–5.1)
Sodium: 139 mmol/L (ref 135–145)

## 2019-11-01 LAB — CBC
HCT: 40.5 % (ref 36.0–46.0)
Hemoglobin: 13.1 g/dL (ref 12.0–15.0)
MCH: 31.3 pg (ref 26.0–34.0)
MCHC: 32.3 g/dL (ref 30.0–36.0)
MCV: 96.9 fL (ref 80.0–100.0)
Platelets: 282 10*3/uL (ref 150–400)
RBC: 4.18 MIL/uL (ref 3.87–5.11)
RDW: 12.7 % (ref 11.5–15.5)
WBC: 6.3 10*3/uL (ref 4.0–10.5)
nRBC: 0 % (ref 0.0–0.2)

## 2019-11-01 SURGERY — ARTHROPLASTY, SHOULDER, TOTAL, REVERSE
Anesthesia: General | Site: Shoulder | Laterality: Right

## 2019-11-01 MED ORDER — FENTANYL CITRATE (PF) 100 MCG/2ML IJ SOLN
INTRAMUSCULAR | Status: AC
  Administered 2019-11-01: 50 ug via INTRAVENOUS
  Filled 2019-11-01: qty 2

## 2019-11-01 MED ORDER — MIDAZOLAM HCL 2 MG/2ML IJ SOLN: 0.5000 mg | Freq: Once | INTRAMUSCULAR | Status: DC | PRN

## 2019-11-01 MED ORDER — LIDOCAINE 2% (20 MG/ML) 5 ML SYRINGE
INTRAMUSCULAR | Status: AC
  Filled 2019-11-01: qty 5

## 2019-11-01 MED ORDER — OXYCODONE HCL 5 MG PO TABS
10.0000 mg | ORAL_TABLET | ORAL | Status: DC | PRN
  Administered 2019-11-02: 10 mg via ORAL
  Filled 2019-11-01: qty 2

## 2019-11-01 MED ORDER — OXYCODONE HCL 5 MG PO TABS: 5.0000 mg | ORAL_TABLET | ORAL | Status: DC | PRN

## 2019-11-01 MED ORDER — METHOCARBAMOL 1000 MG/10ML IJ SOLN
500.0000 mg | Freq: Four times a day (QID) | INTRAVENOUS | Status: DC | PRN
  Filled 2019-11-01: qty 5

## 2019-11-01 MED ORDER — DEXAMETHASONE SODIUM PHOSPHATE 10 MG/ML IJ SOLN
INTRAMUSCULAR | Status: DC | PRN
Start: 2019-11-01 — End: 2019-11-01
  Administered 2019-11-01: 10 mg via INTRAVENOUS

## 2019-11-01 MED ORDER — MEPERIDINE HCL 25 MG/ML IJ SOLN: 6.2500 mg | INTRAMUSCULAR | Status: DC | PRN

## 2019-11-01 MED ORDER — AMLODIPINE BESYLATE 5 MG PO TABS: 2.5000 mg | ORAL_TABLET | Freq: Every day | ORAL | Status: DC

## 2019-11-01 MED ORDER — POLYETHYLENE GLYCOL 3350 17 G PO PACK: 17.0000 g | PACK | Freq: Every day | ORAL | Status: DC | PRN

## 2019-11-01 MED ORDER — MIDAZOLAM HCL 2 MG/2ML IJ SOLN: 1.0000 mg | Freq: Once | INTRAMUSCULAR | Status: AC

## 2019-11-01 MED ORDER — LACTATED RINGERS IV SOLN: INTRAVENOUS | Status: DC

## 2019-11-01 MED ORDER — OXYCODONE HCL 5 MG PO TABS: 5.0000 mg | ORAL_TABLET | Freq: Once | ORAL | Status: DC | PRN

## 2019-11-01 MED ORDER — HYDROMORPHONE HCL 1 MG/ML IJ SOLN: 0.2500 mg | INTRAMUSCULAR | Status: DC | PRN

## 2019-11-01 MED ORDER — TRANEXAMIC ACID-NACL 1000-0.7 MG/100ML-% IV SOLN
1000.0000 mg | INTRAVENOUS | Status: AC
  Administered 2019-11-01: 11:00:00 1000 mg via INTRAVENOUS
  Filled 2019-11-01: qty 100

## 2019-11-01 MED ORDER — CEFAZOLIN SODIUM-DEXTROSE 2-4 GM/100ML-% IV SOLN
2.0000 g | INTRAVENOUS | Status: AC
  Administered 2019-11-01: 2 g via INTRAVENOUS
  Filled 2019-11-01: qty 100

## 2019-11-01 MED ORDER — FENTANYL CITRATE (PF) 250 MCG/5ML IJ SOLN
INTRAMUSCULAR | Status: AC
  Filled 2019-11-01: qty 5

## 2019-11-01 MED ORDER — ACETAMINOPHEN 325 MG PO TABS
325.0000 mg | ORAL_TABLET | Freq: Four times a day (QID) | ORAL | Status: DC | PRN
  Administered 2019-11-02: 650 mg via ORAL
  Filled 2019-11-01: qty 2

## 2019-11-01 MED ORDER — FLUOXETINE HCL 20 MG PO CAPS: 20.0000 mg | ORAL_CAPSULE | Freq: Every day | ORAL | Status: DC

## 2019-11-01 MED ORDER — BUPIVACAINE HCL (PF) 0.25 % IJ SOLN
INTRAMUSCULAR | Status: DC | PRN
  Administered 2019-11-01: 15 mL

## 2019-11-01 MED ORDER — BISACODYL 5 MG PO TBEC: 5.0000 mg | DELAYED_RELEASE_TABLET | Freq: Every day | ORAL | Status: DC | PRN

## 2019-11-01 MED ORDER — FENTANYL CITRATE (PF) 100 MCG/2ML IJ SOLN
INTRAMUSCULAR | Status: DC | PRN
  Administered 2019-11-01 (×2): 50 ug via INTRAVENOUS

## 2019-11-01 MED ORDER — MENTHOL 3 MG MT LOZG: 1.0000 | LOZENGE | OROMUCOSAL | Status: DC | PRN

## 2019-11-01 MED ORDER — METOCLOPRAMIDE HCL 5 MG PO TABS: 5.0000 mg | ORAL_TABLET | Freq: Three times a day (TID) | ORAL | Status: DC | PRN

## 2019-11-01 MED ORDER — 0.9 % SODIUM CHLORIDE (POUR BTL) OPTIME
TOPICAL | Status: DC | PRN
  Administered 2019-11-01: 1000 mL

## 2019-11-01 MED ORDER — PHENYLEPHRINE HCL-NACL 10-0.9 MG/250ML-% IV SOLN
INTRAVENOUS | Status: DC | PRN
Start: 2019-11-01 — End: 2019-11-01
  Administered 2019-11-01: 50 ug/min via INTRAVENOUS

## 2019-11-01 MED ORDER — MIDAZOLAM HCL 2 MG/2ML IJ SOLN
INTRAMUSCULAR | Status: AC
  Administered 2019-11-01: 1 mg via INTRAVENOUS
  Filled 2019-11-01: qty 2

## 2019-11-01 MED ORDER — FENTANYL CITRATE (PF) 100 MCG/2ML IJ SOLN: 50.0000 ug | Freq: Once | INTRAMUSCULAR | Status: AC

## 2019-11-01 MED ORDER — DIPHENHYDRAMINE HCL 12.5 MG/5ML PO ELIX
12.5000 mg | ORAL_SOLUTION | ORAL | Status: DC | PRN
  Filled 2019-11-01: qty 10

## 2019-11-01 MED ORDER — ONDANSETRON HCL 4 MG/2ML IJ SOLN: 4.0000 mg | Freq: Four times a day (QID) | INTRAMUSCULAR | Status: DC | PRN

## 2019-11-01 MED ORDER — METOCLOPRAMIDE HCL 5 MG/ML IJ SOLN: 5.0000 mg | Freq: Three times a day (TID) | INTRAMUSCULAR | Status: DC | PRN

## 2019-11-01 MED ORDER — ALUM & MAG HYDROXIDE-SIMETH 200-200-20 MG/5ML PO SUSP: 30.0000 mL | ORAL | Status: DC | PRN

## 2019-11-01 MED ORDER — HYDROCODONE-ACETAMINOPHEN 5-325 MG PO TABS: 1.0000 | ORAL_TABLET | ORAL | 0 refills | Status: DC | PRN

## 2019-11-01 MED ORDER — MAGNESIUM CITRATE PO SOLN: 1.0000 | Freq: Once | ORAL | Status: DC | PRN

## 2019-11-01 MED ORDER — CHLORHEXIDINE GLUCONATE 0.12 % MT SOLN: 15.0000 mL | Freq: Once | OROMUCOSAL | Status: DC

## 2019-11-01 MED ORDER — SUGAMMADEX SODIUM 200 MG/2ML IV SOLN
INTRAVENOUS | Status: DC | PRN
  Administered 2019-11-01: 200 mg via INTRAVENOUS

## 2019-11-01 MED ORDER — ROCURONIUM BROMIDE 10 MG/ML (PF) SYRINGE
PREFILLED_SYRINGE | INTRAVENOUS | Status: AC
  Filled 2019-11-01: qty 10

## 2019-11-01 MED ORDER — PROMETHAZINE HCL 25 MG/ML IJ SOLN: 6.2500 mg | INTRAMUSCULAR | Status: DC | PRN

## 2019-11-01 MED ORDER — DONEPEZIL HCL 10 MG PO TABS
10.0000 mg | ORAL_TABLET | Freq: Every day | ORAL | Status: DC
  Administered 2019-11-01: 10 mg via ORAL
  Filled 2019-11-01 (×2): qty 1

## 2019-11-01 MED ORDER — RALOXIFENE HCL 60 MG PO TABS
60.0000 mg | ORAL_TABLET | Freq: Every day | ORAL | Status: DC
  Filled 2019-11-01: qty 1

## 2019-11-01 MED ORDER — LIDOCAINE 2% (20 MG/ML) 5 ML SYRINGE
INTRAMUSCULAR | Status: DC | PRN
  Administered 2019-11-01: 20 mg via INTRAVENOUS

## 2019-11-01 MED ORDER — MEMANTINE HCL 10 MG PO TABS
10.0000 mg | ORAL_TABLET | Freq: Two times a day (BID) | ORAL | Status: DC
  Administered 2019-11-01: 10 mg via ORAL
  Filled 2019-11-01 (×3): qty 1

## 2019-11-01 MED ORDER — METHOCARBAMOL 500 MG PO TABS
500.0000 mg | ORAL_TABLET | Freq: Four times a day (QID) | ORAL | Status: DC | PRN
  Administered 2019-11-02: 500 mg via ORAL
  Filled 2019-11-01: qty 1

## 2019-11-01 MED ORDER — HYDROMORPHONE HCL 1 MG/ML IJ SOLN: 0.5000 mg | INTRAMUSCULAR | Status: DC | PRN

## 2019-11-01 MED ORDER — ALBUTEROL SULFATE (2.5 MG/3ML) 0.083% IN NEBU: 3.0000 mL | INHALATION_SOLUTION | Freq: Four times a day (QID) | RESPIRATORY_TRACT | Status: DC | PRN

## 2019-11-01 MED ORDER — ONDANSETRON HCL 4 MG/2ML IJ SOLN
INTRAMUSCULAR | Status: DC | PRN
  Administered 2019-11-01: 4 mg via INTRAVENOUS

## 2019-11-01 MED ORDER — ORAL CARE MOUTH RINSE: 15.0000 mL | Freq: Once | OROMUCOSAL | Status: DC

## 2019-11-01 MED ORDER — ONDANSETRON HCL 4 MG PO TABS: 4.0000 mg | ORAL_TABLET | Freq: Four times a day (QID) | ORAL | Status: DC | PRN

## 2019-11-01 MED ORDER — PROPOFOL 10 MG/ML IV BOLUS
INTRAVENOUS | Status: DC | PRN
  Administered 2019-11-01: 40 mg via INTRAVENOUS
  Administered 2019-11-01: 70 mg via INTRAVENOUS

## 2019-11-01 MED ORDER — PHENOL 1.4 % MT LIQD: 1.0000 | OROMUCOSAL | Status: DC | PRN

## 2019-11-01 MED ORDER — DOCUSATE SODIUM 100 MG PO CAPS
100.0000 mg | ORAL_CAPSULE | Freq: Two times a day (BID) | ORAL | Status: DC
  Administered 2019-11-01: 100 mg via ORAL
  Filled 2019-11-01: qty 1

## 2019-11-01 MED ORDER — OXYCODONE HCL 5 MG/5ML PO SOLN: 5.0000 mg | Freq: Once | ORAL | Status: DC | PRN

## 2019-11-01 MED ORDER — DEXAMETHASONE SODIUM PHOSPHATE 10 MG/ML IJ SOLN
INTRAMUSCULAR | Status: AC
  Filled 2019-11-01: qty 1

## 2019-11-01 MED ORDER — ONDANSETRON HCL 4 MG/2ML IJ SOLN
INTRAMUSCULAR | Status: AC
  Filled 2019-11-01: qty 2

## 2019-11-01 MED ORDER — ROCURONIUM BROMIDE 10 MG/ML (PF) SYRINGE
PREFILLED_SYRINGE | INTRAVENOUS | Status: DC | PRN
  Administered 2019-11-01: 40 mg via INTRAVENOUS
  Administered 2019-11-01: 20 mg via INTRAVENOUS

## 2019-11-01 MED ORDER — PROPOFOL 10 MG/ML IV BOLUS
INTRAVENOUS | Status: AC
  Filled 2019-11-01: qty 20

## 2019-11-01 MED ORDER — PANTOPRAZOLE SODIUM 40 MG PO TBEC: 40.0000 mg | DELAYED_RELEASE_TABLET | Freq: Every day | ORAL | Status: DC

## 2019-11-01 MED ORDER — TRAMADOL HCL 50 MG PO TABS: 50.0000 mg | ORAL_TABLET | Freq: Four times a day (QID) | ORAL | Status: DC | PRN

## 2019-11-01 MED ORDER — BUPIVACAINE LIPOSOME 1.3 % IJ SUSP
INTRAMUSCULAR | Status: DC | PRN
  Administered 2019-11-01: 5 mL via PERINEURAL

## 2019-11-01 SURGICAL SUPPLY — 59 items
BLADE SAW SGTL 83.5X18.5 (BLADE) ×3 IMPLANT
COVER SURGICAL LIGHT HANDLE (MISCELLANEOUS) ×3 IMPLANT
COVER WAND RF STERILE (DRAPES) ×3 IMPLANT
CUP SUT UNIV REVERS 36 NEUTRAL (Cup) ×2 IMPLANT
DERMABOND ADVANCED (GAUZE/BANDAGES/DRESSINGS) ×2
DERMABOND ADVANCED .7 DNX12 (GAUZE/BANDAGES/DRESSINGS) ×1 IMPLANT
DRAPE ORTHO SPLIT 77X108 STRL (DRAPES) ×6
DRAPE SURG 17X11 SM STRL (DRAPES) ×3 IMPLANT
DRAPE SURG ORHT 6 SPLT 77X108 (DRAPES) ×2 IMPLANT
DRAPE U-SHAPE 47X51 STRL (DRAPES) ×3 IMPLANT
DRSG AQUACEL AG ADV 3.5X10 (GAUZE/BANDAGES/DRESSINGS) ×3 IMPLANT
DURAPREP 26ML APPLICATOR (WOUND CARE) ×3 IMPLANT
ELECT BLADE 4.0 EZ CLEAN MEGAD (MISCELLANEOUS) ×3
ELECT CAUTERY BLADE 6.4 (BLADE) ×3 IMPLANT
ELECT REM PT RETURN 9FT ADLT (ELECTROSURGICAL) ×3
ELECTRODE BLDE 4.0 EZ CLN MEGD (MISCELLANEOUS) ×1 IMPLANT
ELECTRODE REM PT RTRN 9FT ADLT (ELECTROSURGICAL) ×1 IMPLANT
FACESHIELD WRAPAROUND (MASK) ×9 IMPLANT
FACESHIELD WRAPAROUND OR TEAM (MASK) ×3 IMPLANT
GLENOID UNI REV MOD 24 +2 LAT (Joint) ×2 IMPLANT
GLENOSPHERE 36 +4 LAT/24 (Joint) ×2 IMPLANT
GLOVE BIO SURGEON STRL SZ7.5 (GLOVE) ×3 IMPLANT
GLOVE BIO SURGEON STRL SZ8 (GLOVE) ×3 IMPLANT
GLOVE EUDERMIC 7 POWDERFREE (GLOVE) ×3 IMPLANT
GLOVE SS BIOGEL STRL SZ 7.5 (GLOVE) ×1 IMPLANT
GLOVE SUPERSENSE BIOGEL SZ 7.5 (GLOVE) ×2
GOWN STRL REUS W/ TWL LRG LVL3 (GOWN DISPOSABLE) ×1 IMPLANT
GOWN STRL REUS W/ TWL XL LVL3 (GOWN DISPOSABLE) ×2 IMPLANT
GOWN STRL REUS W/TWL LRG LVL3 (GOWN DISPOSABLE) ×3
GOWN STRL REUS W/TWL XL LVL3 (GOWN DISPOSABLE) ×6
KIT BASIN OR (CUSTOM PROCEDURE TRAY) ×3 IMPLANT
KIT TURNOVER KIT B (KITS) ×3 IMPLANT
LINER HUMERAL 36 +3MM SM (Shoulder) ×2 IMPLANT
MANIFOLD NEPTUNE II (INSTRUMENTS) ×3 IMPLANT
NDL TAPERED W/ NITINOL LOOP (MISCELLANEOUS) ×1 IMPLANT
NEEDLE TAPERED W/ NITINOL LOOP (MISCELLANEOUS) ×3 IMPLANT
NS IRRIG 1000ML POUR BTL (IV SOLUTION) ×3 IMPLANT
PACK SHOULDER (CUSTOM PROCEDURE TRAY) ×3 IMPLANT
PAD ARMBOARD 7.5X6 YLW CONV (MISCELLANEOUS) ×6 IMPLANT
PIN SET MODULAR GLENOID SYSTEM (PIN) ×2 IMPLANT
RESTRAINT HEAD UNIVERSAL NS (MISCELLANEOUS) ×3 IMPLANT
SCREW CENTRAL MOD 30MM (Screw) ×2 IMPLANT
SCREW PERI LOCK 5.5X24 (Screw) ×4 IMPLANT
SCREW PERI LOCK 5.5X36 (Screw) ×2 IMPLANT
SCREW PERIPHERAL 5.5X20 LOCK (Screw) ×2 IMPLANT
SCREW PERIPHERAL 5.5X28 LOCK (Screw) ×2 IMPLANT
SLING ARM FOAM STRAP LRG (SOFTGOODS) IMPLANT
SPONGE LAP 18X18 RF (DISPOSABLE) ×3 IMPLANT
SPONGE LAP 4X18 RFD (DISPOSABLE) ×3 IMPLANT
STEM HUMERAL UNI REVERS SZ6 (Stem) ×2 IMPLANT
SUT FIBERTAPE CERCLAGE 2 48 (SUTURE) ×2 IMPLANT
SUT MNCRL AB 3-0 PS2 18 (SUTURE) ×3 IMPLANT
SUT MON AB 2-0 CT1 27 (SUTURE) ×3 IMPLANT
SUT VIC AB 1 CT1 27 (SUTURE) ×3
SUT VIC AB 1 CT1 27XBRD ANBCTR (SUTURE) ×1 IMPLANT
SUTURE TAPE 1.3 40 TPR END (SUTURE) ×1 IMPLANT
SUTURETAPE 1.3 40 TPR END (SUTURE) ×3
TOWEL GREEN STERILE (TOWEL DISPOSABLE) ×3 IMPLANT
WATER STERILE IRR 1000ML POUR (IV SOLUTION) ×3 IMPLANT

## 2019-11-01 NOTE — Anesthesia Procedure Notes (Signed)
Procedure Name: Intubation Date/Time: 11/01/2019 10:19 AM Performed by: Kyung Rudd, CRNA Pre-anesthesia Checklist: Patient identified, Emergency Drugs available, Suction available and Patient being monitored Patient Re-evaluated:Patient Re-evaluated prior to induction Oxygen Delivery Method: Circle system utilized Preoxygenation: Pre-oxygenation with 100% oxygen Induction Type: IV induction Ventilation: Mask ventilation without difficulty and Oral airway inserted - appropriate to patient size Laryngoscope Size: Mac and 3 Grade View: Grade II Tube type: Oral Tube size: 7.0 mm Number of attempts: 1 Airway Equipment and Method: Stylet Placement Confirmation: ETT inserted through vocal cords under direct vision,  positive ETCO2 and breath sounds checked- equal and bilateral Secured at: 21 cm Tube secured with: Tape Dental Injury: Teeth and Oropharynx as per pre-operative assessment

## 2019-11-01 NOTE — Transfer of Care (Signed)
Immediate Anesthesia Transfer of Care Note  Patient: Deborah Moreno  Procedure(s) Performed: REVERSE SHOULDER ARTHROPLASTY (Right Shoulder)  Patient Location: PACU  Anesthesia Type:GA combined with regional for post-op pain  Level of Consciousness: awake, alert  and oriented  Airway & Oxygen Therapy: Patient Spontanous Breathing and Patient connected to nasal cannula oxygen  Post-op Assessment: Report given to RN and Post -op Vital signs reviewed and stable  Post vital signs: Reviewed and stable  Last Vitals:  Vitals Value Taken Time  BP 140/74 11/01/19 1255  Temp 36.5 C 11/01/19 1255  Pulse 71 11/01/19 1255  Resp 30 11/01/19 1255  SpO2 100 % 11/01/19 1255  Vitals shown include unvalidated device data.  Last Pain:  Vitals:   11/01/19 0843  TempSrc:   PainSc: 5       Patients Stated Pain Goal: 2 (11/01/19 2440)  Complications: No apparent anesthesia complications

## 2019-11-01 NOTE — H&P (Signed)
Deborah Moreno    Chief Complaint: Right 2-part proximal humerus fracture HPI: The patient is a 79 y.o. female with severely displaced and impacted right 2 part proximal humerus fracture.  Due to the degree of bone loss and collapse of the humeral head was discussed with patient and family members proceeding with reverse shoulder arthroplasty to hopefully optimize her functional recovery and pain control  Past Medical History:  Diagnosis Date  . Atypical chest pain   . Childhood asthma   . Colon polyps   . Degenerative arthritis   . Dementia (HCC)   . Depression   . GERD (gastroesophageal reflux disease)   . Hypertension   . Left knee injury    DISLOCATION  . 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 PONV (postoperative nausea and vomiting)   . Vitamin B12 deficiency     Past Surgical History:  Procedure Laterality Date  . CARPAL TUNNEL RELEASE Right 1995  . COLONOSCOPY  2006   OCT  . DILATION AND CURETTAGE OF UTERUS  1981   1990  . HIP PINNING,CANNULATED Left 07/16/2016   Procedure: CANNULATED HIP PINNING;  Surgeon: Samson Frederic, MD;  Location: [facility] OR;  Service: Orthopedics;  Laterality: Left;  . TONSILLECTOMY AND ADENOIDECTOMY  1944  . VAGINAL HYSTERECTOMY  2003  . VOLAR GANGLION CYST     INJECTION    Family History  Problem Relation Age of Onset  . Breast cancer Mother   . Parkinsonism Father   . Alzheimer's disease Father   . Dementia Father   . Alcohol abuse Paternal Uncle     Social History:  reports that she has never smoked. She has never used smokeless tobacco. She reports that she does not drink alcohol or use drugs.   No medications prior to admission.     Physical Exam: Right shoulder demonstrates profoundly restricted mobility.  Diffuse swelling and tenderness.  Grossly neurovascular intact distally.  Radiographs confirm a severely impacted and angulated 2 part proximal humerus fracture with complete collapse of the articular segment.  Vitals      Assessment/Plan  Impression: Right 2-part proximal humerus fracture  Plan of Action: Procedure(s): REVERSE SHOULDER ARTHROPLASTY  Abdelrahman Nair M Winn Muehl 11/01/2019, 6:55 AM Contact # [phone]

## 2019-11-01 NOTE — Anesthesia Postprocedure Evaluation (Signed)
Anesthesia Post Note  Patient: Deborah Moreno  Procedure(s) Performed: REVERSE SHOULDER ARTHROPLASTY (Right Shoulder)     Patient location during evaluation: PACU Anesthesia Type: General Level of consciousness: oriented, awake and alert and patient cooperative Pain management: pain level controlled Vital Signs Assessment: post-procedure vital signs reviewed and stable Respiratory status: spontaneous breathing, nonlabored ventilation, respiratory function stable and patient connected to nasal cannula oxygen Cardiovascular status: blood pressure returned to baseline and stable Postop Assessment: no apparent nausea or vomiting Anesthetic complications: no    Last Vitals:  Vitals:   11/01/19 1325 11/01/19 1340  BP: 119/69 (!) 122/59  Pulse: 66 63  Resp: 14 14  Temp:  36.7 C  SpO2: 100% 100%    Last Pain:  Vitals:   11/01/19 1325  TempSrc:   PainSc: 0-No pain                 Devone Bonilla,E. Adan Beal

## 2019-11-01 NOTE — Discharge Instructions (Signed)
 Deborah Moreno, M.D., F.A.A.O.S. Orth[facility] Specializing in Arthroscopic and Reconstructive Surgery of the Shoulder [phone] [address] - [place], [place] [address] - Fax [phone]   POST-OP TOTAL SHOULDER REPLACEMENT INSTRUCTIONS  1. Follow up in the office for your first post-op appointment 10-14 days from the date of your surgery. If you do not already have a scheduled appointment, our office will contact you to schedule.  2. The bandage over your incision is waterproof. You may begin showering with this dressing on. You may leave this dressing on until first follow up appointment within 2 weeks. We prefer you leave this dressing in place until follow up however after 5-7 days if you are having itching or skin irritation and would like to remove it you may do so. Go slow and tug at the borders gently to break the bond the dressing has with the skin. At this point if there is no drainage it is okay to go without a bandage or you may cover it with a light guaze and tape. You can also expect significant bruising around your shoulder that will drift down your arm and into your chest wall. This is very normal and should resolve over several days.   3. Wear your sling/immobilizer at all times except to perform the exercises below or to occasionally let your arm dangle by your side to stretch your elbow. You also need to sleep in your sling immobilizer until instructed otherwise. It is ok to remove your sling if you are sitting in a controlled environment and allow your arm to rest in a position of comfort by your side or on your lap with pillows to give your neck and skin a break from the sling. You may remove it to allow arm to dangle by side to shower. If you are up walking around and when you go to sleep at night you need to wear it.  4. Range of motion to your elbow, wrist, and hand are encouraged 3-5 times daily. Exercise to your hand and fingers helps to reduce  swelling you may experience.   5. Prescriptions for a pain medication and a muscle relaxant are provided for you. It is recommended that if you are experiencing pain that you pain medication alone is not controlling, add the muscle relaxant along with the pain medication which can give additional pain relief. The first 1-2 days is generally the most severe of your pain and then should gradually decrease. As your pain lessens it is recommended that you decrease your use of the pain medications to an "as needed basis'" only and to always comply with the recommended dosages of the pain medications.  6. Pain medications can produce constipation along with their use. If you experience this, the use of an over the counter stool softener or laxative daily is recommended.   7. For additional questions or concerns, please do not hesitate to call the office. If after hours there is an answering service to forward your concerns to the physician on call.  8.Pain control following an exparel block  To help control your post-operative pain you received a nerve block  performed with Exparel which is a long acting anesthetic (numbing agent) which can provide pain relief and sensations of numbness (and relief of pain) in the operative shoulder and arm for up to 3 days. Sometimes it provides mixed relief, meaning you may still have numbness in certain areas of the arm but can still be able to   move  parts of that arm, hand, and fingers. We recommend that your prescribed pain medications  be used as needed. We do not feel it is necessary to "pre medicate" and "stay ahead" of pain.  Taking narcotic pain medications when you are not having any pain can lead to unnecessary and potentially dangerous side effects.    9. Use the ice machine as much as possible in the first 5-7 days from surgery, then you can wean its use to as needed. The ice typically needs to be replaced every 6 hours, instead of ice you can actually freeze  water bottles to put in the cooler and then fill water around them to avoid having to purchase ice. You can have spare water bottles freezing to allow you to rotate them once they have melted. Try to have a thin shirt or light cloth or towel under the ice wrap to protect your skin.   10.  We recommend that you avoid any dental work or cleaning in the first 3 months following your joint replacement. This is to help minimize the possibility of infection from the bacteria in your mouth that enters your bloodstream during dental work. We also recommend that you take an antibiotic prior to your dental work for the first year after your shoulder replacement to further help reduce that risk. Please simply contact our office for antibiotics to be sent to your pharmacy prior to dental work.  POST-OP EXERCISES  Ok to allow arm to dangle by side and move elbow wrist and hand for hygiene, etc

## 2019-11-01 NOTE — Anesthesia Procedure Notes (Signed)
Anesthesia Regional Block: Interscalene brachial plexus block   Pre-Anesthetic Checklist: ,, timeout performed, Correct Patient, Correct Site, Correct Laterality, Correct Procedure, Correct Position, site marked, Risks and benefits discussed,  Surgical consent,  Pre-op evaluation,  At surgeon's request and post-op pain management  Laterality: Right and Upper  Prep: chloraprep       Needles:  Injection technique: Single-shot  Needle Type: Echogenic Stimulator Needle     Needle Length: 9cm  Needle Gauge: 21     Additional Needles:   Procedures:, nerve stimulator,,, ultrasound used (permanent image in chart),,,,   Nerve Stimulator or Paresthesia:  Response: finger twitch, 0.4 mA, 0.1 ms,   Additional Responses:   Narrative:  Start time: 11/01/2019 9:50 AM End time: 11/01/2019 9:57 AM Injection made incrementally with aspirations every 5 mL.  Performed by: Personally   Additional Notes: Pt identified in Holding room.  Monitors applied. Working IV access confirmed. Sterile prep R neck.  #21ga ECHOgenic PNS to finger twitch at 0.32mA threshold.  15cc 0.25% Bupivacaine with Exparel injected incrementally after negative test dose.  Patient asymptomatic, VSS, no heme aspirated, tolerated well.  Sandford Craze, MD

## 2019-11-01 NOTE — Op Note (Signed)
11/01/2019  11:57 AM  PATIENT:   Deborah Moreno  79 y.o. female  PRE-OPERATIVE DIAGNOSIS: Severely impacted and angulated right 2-part proximal humerus fracture  POST-OPERATIVE DIAGNOSIS: Same  PROCEDURE: Right shoulder reverse arthroplasty utilizing a press-fit size 6 Arthrex stem with a +3 polyethylene insert, 36/+4 glenosphere on a small/+2 baseplate  SURGEON:  Ilda Laskin, Vania Rea M.D.  ASSISTANTS: Ralene Bathe, PA-C  ANESTHESIA:   General endotracheal and interscalene block with Exparel  EBL: 150 cc  SPECIMEN: None  Drains: None   PATIENT DISPOSITION:  PACU - hemodynamically stable.    PLAN OF CARE: Admit for overnight observation  Brief history:  Deborah Moreno is an 79 year old female status post ground-level fall sustaining a severely impacted and angulated right 2 part proximal humerus fracture.  Her plain radiographs demonstrate that there is a thin shell of cortical subchondral bone which is markedly angulated and due to the degree of displacement we have discussed with the patient and family members operative treatment in the form of reverse shoulder arthroplasty.  Potential risks versus benefits were reviewed at length including the potential for bleeding, infection, neurovascular injury, persistent pain, loss of motion, failure of the implant, anesthetic complication, and possible need for additional surgery.  They understand, and accept, and agree with our planned procedure.  Procedure in detail:  After undergoing routine preop evaluation patient received prophylactic antibiotics and interscalene block with Exparel was established in the holding area by the anesthesia department.  Patient subsequently placed supine on the operating table and underwent the smooth induction of a general endotracheal anesthesia.  Placed into the beachchair position and appropriately padded and protected.  The right shoulder girdle region was sterilely prepped and draped in standard fashion.   Timeout was called.  An anterior deltopectoral approach to the right shoulder is made through a 10 cm incision.  Skin flaps were elevated dissection carried deeply and the deltopectoral interval was developed from proximal to distal with the vein taken laterally.  The upper centimeter and a half of the pectoralis major was tenotomized for exposure and the conjoined tendon was then mobilized and retracted medially.  We were able to manipulate the shoulder and gain overall general reduction making exposure easier.  Given the fracture pattern we performed a subscapularis peel and split the rotator cuff along the rotator interval and then tagged the free margin of the subscapularis with a pair of FiberWire sutures.  The fracture traversed primarily the surgical neck region and so I utilized an oscillating saw to perform a resection of the articular segment thus preserving the attachment of the superior and posterior rotator cuff to the tuberosity and tagged the tuberosity with FiberWire sutures.  We then exposed the glenoid with the appropriate retractors gaining complete visualization I performed a circumferential labral resection.  A guidepin was then directed into the center of the glenoid with an approximately 10 degree inferior tilt and the glenoid was then reamed to a stable subchondral bony bed with both the central and peripheral reamers and all debris was carefully irrigated free and removed.  The glenoid was then terminally prepared with the central drill and tap a 30 mm lag screw was then applied to the baseplate and the baseplate was then inserted with excellent fit and fixation and the peripheral locking screws were all then placed using standard technique achieving good purchase and fixation.  A 36/+4 glenosphere was then impacted onto the baseplate and the central locking screw was placed.  We then returned our attention to  the proximal humerus and we stabilized the fracture proximal segment holding it in  anatomic position and went ahead and broached up to a size 6 and then used our metaphyseal reamer to gain proper clearance for the metaphysis.  A trial reduction was then performed and this showed good soft tissue balance.  At this point we assembled our final implant and in addition we placed a cerclage suture around the humeral metaphysis to help support and protect the proximal humeral bone stock.  We then terminally seated the final implant.  We did need to rebroach to allow some further seating of the final implant and once this was achieved and again we maintained the native retroversion of approximate 30 degrees the implant was then terminally seated and so we achieved an appropriate reduction with good soft tissue balance using a +3 polyethylene insert.  The trial was then removed the final +3 polywas impacted.  Final reduction was then completed.  The cerclage wire was then terminally tightened.  Good shoulder motion and good stability was achieved.  We then repaired the greater tuberosity utilizing the sutures through the eyelets on the collar of the implant as well as a pair of sutures connecting down to our cerclage wire over the anterior humeral cortical region.  This allowed excellent reapposition of the greater tuberosity.  The subscapularis was then repaired back to the collar of her implant utilizing the previously placed FiberWire sutures.  The rotator interval was then repaired with suture tape sutures.  Upon completion was very pleased with the overall construct and stability with good motion and good soft tissue balance.  Final irrigation was then completed.  The deltopectoral interval was then reapproximated with a series of figure-of-eight #1 Vicryl sutures.  2-0 Vicryl used for subcu layer and intracuticular 3-0 Monocryl for the skin followed by Dermabond and Aquasol dressing right arm was placed in a sling the patient was awakened, extubated, and taken to the recovery room in stable  condition.  Deborah Loges, PA-C was utilized as an Environmental consultant throughout this case, essential for help with positioning the patient, positioning extremity, tissue manipulation, implantation of the prosthesis, suture management, wound closure, and intraoperative decision-making.  Marin Shutter MD   Contact # [phone]

## 2019-11-01 NOTE — Anesthesia Preprocedure Evaluation (Signed)
Anesthesia Evaluation  Patient identified by MRN, date of birth, ID band Patient awake    Reviewed: Allergy & Precautions, NPO status , Patient's Chart, lab work & pertinent test results  History of Anesthesia Complications (+) PONV  Airway Mallampati: II  TM Distance: >3 FB Neck ROM: Full    Dental  (+) Dental Advisory Given, Caps   Pulmonary COPD,  COPD inhaler, Current Smoker,  10/31/2019 SARS coronavirus NEG   breath sounds clear to auscultation       Cardiovascular hypertension, Pt. on medications  Rhythm:Regular Rate:Normal     Neuro/Psych Depression Dementia negative neurological ROS     GI/Hepatic Neg liver ROS, GERD  Controlled and Medicated,  Endo/Other  negative endocrine ROS  Renal/GU negative Renal ROS     Musculoskeletal  (+) Arthritis ,   Abdominal   Peds  Hematology negative hematology ROS (+)   Anesthesia Other Findings   Reproductive/Obstetrics                             Anesthesia Physical Anesthesia Plan  ASA: III  Anesthesia Plan: General   Post-op Pain Management: GA combined w/ Regional for post-op pain   Induction: Intravenous  PONV Risk Score and Plan: 4 or greater and Dexamethasone, Ondansetron and Treatment may vary due to age or medical condition  Airway Management Planned: Oral ETT  Additional Equipment:   Intra-op Plan:   Post-operative Plan: Extubation in OR  Informed Consent: I have reviewed the patients History and Physical, chart, labs and discussed the procedure including the risks, benefits and alternatives for the proposed anesthesia with the patient or authorized representative who has indicated his/her understanding and acceptance.     Dental advisory given  Plan Discussed with: CRNA and Surgeon  Anesthesia Plan Comments: (Plan routine monitors, GETA with interscalene block for post op analgesia)        Anesthesia Quick  Evaluation

## 2019-11-02 ENCOUNTER — Encounter: Payer: Self-pay | Admitting: *Deleted

## 2019-11-02 DIAGNOSIS — S42221A 2-part displaced fracture of surgical neck of right humerus, initial encounter for closed fracture: Secondary | ICD-10-CM | POA: Diagnosis not present

## 2019-11-02 MED ORDER — ONDANSETRON 4 MG PO TBDP: 4.0000 mg | ORAL_TABLET | Freq: Three times a day (TID) | ORAL | 0 refills | Status: DC | PRN

## 2019-11-02 MED ORDER — METHOCARBAMOL 500 MG PO TABS: 500.0000 mg | ORAL_TABLET | Freq: Four times a day (QID) | ORAL | 1 refills | Status: DC | PRN

## 2019-11-02 NOTE — Evaluation (Signed)
Occupational Therapy Evaluation Patient Details Name: Deborah Moreno MRN: [id number] DOB: 03-31-1941 Today's Date: 11/02/2019    History of Present Illness Pt is an 79 y/o female s/p R reverse shoulder arthroplasty. PMH including but not limited to dementia   Clinical Impression   Pt PTA: Pt living at home with son who works during the day. Pt reports independence prior with ADL and mobility. Pt currently minA overall for ADL due to RUE limitations. Pt minguardA for mobility holding on to LUE for stability.  Limited to AROM of elbow, wrist and hand; minimal PROM for ADL only shoulder ER 10*, sh abduction 45*, 60* FE. Pt performing ADL routine with minA overall for dressing and superivsionA at sink. Pt education on sling donning/doffing and wear schedule and HEP for elbow, wrist and hand exercises provided. Shoulder precautions with hand out provided: Educated patient on don doff brace with return demonstration,washing under arms with cloth, never to wash directly on incision site, avoid shoulder movement. positioning with pillows in chair for , sleeping positioning (recommend recliner if possible), pt educated on dressing care during bathing/ dressing.  Home exercise program for elbow, wrist and hand only). Pt appears to have poor carry over skills and would benefit from f/up OT in [facility] setting.    Follow Up Recommendations  Home health OT;Follow surgeon's recommendation for DC plan and follow-up therapies;Supervision/Assistance - 24 hour(initial 24/7)    Equipment Recommendations  None recommended by OT    Recommendations for Other Services       Precautions / Restrictions Precautions Precautions: Fall;Shoulder Type of Shoulder Precautions: PROM 10 ER, 45 ABD, 60 FE; PROM for ADLs ONLY Shoulder Interventions: Shoulder sling/immobilizer Required Braces or Orthoses: Sling Splint/Cast - Date Prophylactic Dressing Applied (if applicable): 11/01/19 Restrictions Weight Bearing Restrictions:  Yes RLE Weight Bearing: Non weight bearing      Mobility Bed Mobility               General bed mobility comments: pt OOB in recliner chair upon arrival  Transfers Overall transfer level: Needs assistance Equipment used: None Transfers: Sit to/from Stand Sit to Stand: Min guard         General transfer comment: pt bending far forward with anterior weight shift for sit to stands.    Balance Overall balance assessment: Needs assistance Sitting-balance support: Feet supported Sitting balance-Leahy Scale: Good     Standing balance support: During functional activity;No upper extremity supported Standing balance-Leahy Scale: Fair                             ADL either performed or assessed with clinical judgement   ADL Overall ADL's : Needs assistance/impaired Eating/Feeding: Set up;Sitting Eating/Feeding Details (indicate cue type and reason): assist to cut up pancakes Grooming: Supervision/safety;Standing   Upper Body Bathing: Minimal assistance;Sitting;Standing   Lower Body Bathing: Minimal assistance;Sitting/lateral leans;Sit to/from stand   Upper Body Dressing : Minimal assistance;Cueing for safety;Sitting;Standing Upper Body Dressing Details (indicate cue type and reason): donning most of RUE sleeve with LUE; RUE supportedon lap Lower Body Dressing: Minimal assistance;Sitting/lateral leans;Sit to/from stand Lower Body Dressing Details (indicate cue type and reason): Assist to don R side fo pants to waist Toilet Transfer: Supervision/safety;Ambulation;Regular Teacher, adult education Details (indicate cue type and reason): Pt bends way forward for sit to stand         Functional mobility during ADLs: Min guard General ADL Comments: Pt performing ADL routine with minA overall for dressing  and superivsionA at sink. Pt education on sling donning/doffing and wear schedule. Pt appears to have poor carry over skills and would benefit from f/up OT in [facility]  setting.     Vision Baseline Vision/History: Wears glasses Wears Glasses: At all times Patient Visual Report: No change from baseline Vision Assessment?: No apparent visual deficits     Perception     Praxis      Pertinent Vitals/Pain Pain Assessment: 0-10 Pain Score: 5  Pain Location: R shoulder Pain Descriptors / Indicators: Guarding;Grimacing Pain Intervention(s): Monitored during session     Hand Dominance Right   Extremity/Trunk Assessment Upper Extremity Assessment Upper Extremity Assessment: Generalized weakness;RUE deficits/detail RUE Deficits / Details: Limited to AROM of elbow, wrist and hand; minimal PROM for ADL only shoulder ER 10*, sh abduction 45*, 60* FE RUE Coordination: decreased fine motor;decreased gross motor   Lower Extremity Assessment Lower Extremity Assessment: Generalized weakness   Cervical / Trunk Assessment Cervical / Trunk Assessment: Kyphotic   Communication Communication Communication: No difficulties   Cognition Arousal/Alertness: Awake/alert Behavior During Therapy: WFL for tasks assessed/performed Overall Cognitive Status: History of cognitive impairments - at baseline Area of Impairment: Safety/judgement                         Safety/Judgement: Decreased awareness of deficits;Decreased awareness of safety     General Comments: Pt unable to state precautions, but pt aware of sling's use and did not try to use RUE in sling.   General Comments  no family in room    Exercises     Shoulder Instructions      Home Living Family/patient expects to be discharged to:: Private residence Living Arrangements: Children Available Help at Discharge: Family;Available PRN/intermittently Type of Home: House Home Access: Stairs to enter [employer] of Steps: 2 Entrance Stairs-Rails: None Home Layout: One level     Bathroom Shower/Tub: Occupational psychologist: Standard     Home Equipment: Other  (comment)(walking stick)          Prior Functioning/Environment Level of Independence: Independent                 OT Problem List: Decreased strength;Decreased activity tolerance;Impaired balance (sitting and/or standing);Decreased range of motion;Decreased coordination;Decreased safety awareness;Decreased knowledge of use of DME or AE;Decreased knowledge of precautions;Pain;Impaired UE functional use;Increased edema      OT Treatment/Interventions:      OT Goals(Current goals can be found in the care plan section) Acute Rehab OT Goals Patient Stated Goal: decrease pain OT Goal Formulation: All assessment and education complete, DC therapy Potential to Achieve Goals: Good  OT Frequency:     Barriers to D/C:            Co-evaluation              AM-PAC OT "6 Clicks" Daily Activity     Outcome Measure Help from another person eating meals?: A Little Help from another person taking care of personal grooming?: A Little Help from another person toileting, which includes using toliet, bedpan, or urinal?: None Help from another person bathing (including washing, rinsing, drying)?: A Little Help from another person to put on and taking off regular upper body clothing?: A Little Help from another person to put on and taking off regular lower body clothing?: A Little 6 Click Score: 19   End of Session Nurse Communication: Mobility status  Activity Tolerance: Patient limited by pain Patient left: in  chair;with call bell/phone within reach  OT Visit Diagnosis: Unsteadiness on feet (R26.81);Muscle weakness (generalized) (M62.81);Pain Pain - Right/Left: Right Pain - part of body: Shoulder                Time: 2951-8841 OT Time Calculation (min): 31 min Charges:  OT General Charges $OT Visit: 1 Visit OT Evaluation $OT Eval Moderate Complexity: 1 Mod OT Treatments $Self Care/Home Management : 8-22 mins  Flora Lipps, OTR/L Acute Rehabilitation Services Pager:  [phone] Office: [phone]   Jessee Mezera C 11/02/2019, 10:36 AM

## 2019-11-02 NOTE — Evaluation (Signed)
Physical Therapy Evaluation Patient Details Name: Deborah Moreno MRN: [id number] DOB: Oct 22, 1940 Today's Date: 11/02/2019   History of Present Illness  Pt is an 79 y/o female s/p R reverse shoulder arthroplasty. PMH including but not limited to dementia & HTN.  Clinical Impression  Pt presented sitting upright in recliner chair, awake and willing to participate in therapy session. Prior to admission, pt reported that she was independent with all functional mobility and ADLs. Pt lives with her son in a single level home with a couple of steps to enter. At the time of evaluation, pt overall needing min A for stability and safety with functional mobility. Recommended that pt use her cane for mobility and have her son with her at all times initially. Pt agreeable. Pt would continue to benefit from skilled physical therapy services at this time while admitted and after d/c to address the below listed limitations in order to improve overall safety and independence with functional mobility.     Follow Up Recommendations Home health PT;Supervision/Assistance - 24 hour    Equipment Recommendations  None recommended by PT;Other (comment)(pt reporting that she has a cane at home)    Recommendations for Other Services       Precautions / Restrictions Precautions Precautions: Fall;Shoulder Type of Shoulder Precautions: PROM 10 ER, 45 ABD, 60 FE; PROM for ADLs ONLY Shoulder Interventions: Shoulder sling/immobilizer Required Braces or Orthoses: Sling Splint/Cast - Date Prophylactic Dressing Applied (if applicable): 11/01/19 Restrictions Weight Bearing Restrictions: Yes RLE Weight Bearing: Non weight bearing      Mobility  Bed Mobility               General bed mobility comments: pt OOB in recliner chair upon arrival  Transfers Overall transfer level: Needs assistance Equipment used: None Transfers: Sit to/from Stand Sit to Stand: Min guard         General transfer comment: pt with  excessive anterior weight shift and hip/trunk flexion for transitional movement into standing from recliner chair; no physical assistance needed  Ambulation/Gait Ambulation/Gait assistance: Min assist Gait Distance (Feet): 40 Feet Assistive device: 1 person hand held assist Gait Pattern/deviations: Step-through pattern;Decreased stride length;Shuffle Gait velocity: WFL   General Gait Details: pt with modest instability and reliant on L UE support of 1HHA from PT; pt with quick, shuffling steps  Stairs            Wheelchair Mobility    Modified Rankin (Stroke Patients Only)       Balance Overall balance assessment: Needs assistance Sitting-balance support: Feet supported Sitting balance-Leahy Scale: Good     Standing balance support: During functional activity;No upper extremity supported Standing balance-Leahy Scale: Fair                               Pertinent Vitals/Pain Pain Assessment: 0-10 Pain Score: 5  Pain Location: R shoulder Pain Descriptors / Indicators: Guarding;Grimacing Pain Intervention(s): Monitored during session;Repositioned    Home Living Family/patient expects to be discharged to:: Private residence Living Arrangements: Children Available Help at Discharge: Family;Available PRN/intermittently Type of Home: House Home Access: Stairs to enter Entrance Stairs-Rails: None Entrance Stairs-Number of Steps: 2 Home Layout: One level Home Equipment: Other (comment)(walking stick)      Prior Function Level of Independence: Independent               Hand Dominance   Dominant Hand: Right    Extremity/Trunk Assessment   Upper Extremity Assessment  Upper Extremity Assessment: Defer to OT evaluation    Lower Extremity Assessment Lower Extremity Assessment: Generalized weakness    Cervical / Trunk Assessment Cervical / Trunk Assessment: Kyphotic  Communication   Communication: No difficulties  Cognition  Arousal/Alertness: Awake/alert Behavior During Therapy: WFL for tasks assessed/performed Overall Cognitive Status: History of cognitive impairments - at baseline Area of Impairment: Safety/judgement                         Safety/Judgement: Decreased awareness of deficits;Decreased awareness of safety            General Comments      Exercises     Assessment/Plan    PT Assessment Patient needs continued PT services  PT Problem List Decreased strength;Decreased range of motion;Decreased activity tolerance;Decreased balance;Decreased mobility;Decreased coordination;Decreased cognition;Decreased knowledge of use of DME;Decreased safety awareness;Decreased knowledge of precautions;Pain       PT Treatment Interventions DME instruction;Gait training;Stair training;Functional mobility training;Therapeutic activities;Therapeutic exercise;Balance training;Neuromuscular re-education;Cognitive remediation;Patient/family education    PT Goals (Current goals can be found in the Care Plan section)  Acute Rehab PT Goals Patient Stated Goal: decrease pain PT Goal Formulation: With patient Time For Goal Achievement: 11/16/19 Potential to Achieve Goals: Good    Frequency Min 3X/week   Barriers to discharge        Co-evaluation               AM-PAC PT "6 Clicks" Mobility  Outcome Measure Help needed turning from your back to your side while in a flat bed without using bedrails?: A Little Help needed moving from lying on your back to sitting on the side of a flat bed without using bedrails?: A Little Help needed moving to and from a bed to a chair (including a wheelchair)?: A Little Help needed standing up from a chair using your arms (e.g., wheelchair or bedside chair)?: A Little Help needed to walk in hospital room?: A Little Help needed climbing 3-5 steps with a railing? : A Little 6 Click Score: 18    End of Session Equipment Utilized During Treatment: Other  (comment)(R UE sling) Activity Tolerance: Patient tolerated treatment well Patient left: in chair;with call bell/phone within reach Nurse Communication: Mobility status;Weight bearing status;Precautions PT Visit Diagnosis: Other abnormalities of gait and mobility (R26.89)    Time: 8338-2505 PT Time Calculation (min) (ACUTE ONLY): 10 min   Charges:   PT Evaluation $PT Eval Low Complexity: 1 Low          Eduard Clos, PT, DPT  Acute Rehabilitation Services Pager [phone] Office [place] 11/02/2019, 10:21 AM

## 2019-11-02 NOTE — Progress Notes (Signed)
   Subjective:  Patient reports pain as mild.  Doing very well.  No overnight events.  Block wearing off.  Objective:   VITALS:   Vitals:   11/01/19 2009 11/01/19 2328 11/02/19 0436 11/02/19 0741  BP: 120/71 118/65 (!) 122/55 107/63  Pulse: 75 87 83 71  Resp: 18 18 18 16   Temp: 97.6 F (36.4 C) 98.1 F (36.7 C) 98.3 F (36.8 C) 98.3 F (36.8 C)  TempSrc: Oral Oral Oral Oral  SpO2: 94% 95% 96% 95%  Weight:      Height:        Neurovascular intact Sensation intact distally Incision: dressing C/D/I and no drainage sling in place Motor weak 2/2 PNB  Lab Results  Component Value Date   WBC 6.3 11/01/2019   HGB 13.1 11/01/2019   HCT 40.5 11/01/2019   MCV 96.9 11/01/2019   PLT 282 11/01/2019   BMET    Component Value Date/Time   NA 139 11/01/2019 0824   NA 144 07/25/2016 0000   K 2.8 (L) 11/01/2019 0824   CL 102 11/01/2019 0824   CO2 25 11/01/2019 0824   GLUCOSE 77 11/01/2019 0824   BUN 12 11/01/2019 0824   BUN 10 07/25/2016 0000   CREATININE 0.61 11/01/2019 0824   CALCIUM 8.7 (L) 11/01/2019 0824   GFRNONAA >60 11/01/2019 0824   GFRAA >60 11/01/2019 0824     Assessment/Plan: 1 Day Post-Op   Active Problems:   S/P reverse total shoulder arthroplasty, right   Advance diet - fNWB to RUE - maintain sling unless ADLs and exercise - dc home today - fu Dr. 01/01/2020 2 weeks   Rennis Chris 11/02/2019, 10:53 AM   01/02/2020, MD [phone]

## 2019-11-02 NOTE — Progress Notes (Signed)
Patient is discharged from room 3C04 at this time. Alert and in stable condition. IV site d/c'd and instructions read to patient  and son with understanding verbalized and all questions answered. Left unit via wheelchair with all belongings at side. ?

## 2019-11-14 ENCOUNTER — Telehealth: Payer: Self-pay | Admitting: Neurology

## 2019-11-14 NOTE — Telephone Encounter (Signed)
Pt daughter [name] called back in regards to call they received about study would like to further discuss with RN

## 2019-11-14 NOTE — Telephone Encounter (Signed)
Message given to [place] in Research. Marcelino Duster with research will call back.

## 2019-12-30 ENCOUNTER — Other Ambulatory Visit: Payer: Self-pay | Admitting: Neurology

## 2020-02-05 ENCOUNTER — Other Ambulatory Visit (INDEPENDENT_AMBULATORY_CARE_PROVIDER_SITE_OTHER): Payer: Self-pay

## 2020-02-05 DIAGNOSIS — Z0289 Encounter for other administrative examinations: Secondary | ICD-10-CM

## 2020-02-26 ENCOUNTER — Encounter: Payer: Self-pay | Admitting: Neurology

## 2020-02-26 ENCOUNTER — Ambulatory Visit: Payer: Medicare Other | Admitting: Neurology

## 2020-02-26 ENCOUNTER — Other Ambulatory Visit: Payer: Self-pay

## 2020-02-26 VITALS — BP 134/63 | HR 62 | Ht 60.0 in | Wt 93.8 lb

## 2020-02-26 DIAGNOSIS — R413 Other amnesia: Secondary | ICD-10-CM | POA: Diagnosis not present

## 2020-02-26 NOTE — Progress Notes (Signed)
PATIENT: Deborah Moreno DOB: [birth year]  REASON FOR VISIT: follow up HISTORY FROM: patient  HISTORY OF PRESENT ILLNESS: Today 02/26/20 Deborah Moreno is an 79 year old female with history of progressive memory disturbance.  She lives with her son.  She remains on Aricept and Namenda.  She had a fall back in May, had to have right shoulder replacement.  Since then, she has had an in-home aide from 1 pm-midnight, while her son is working.  The aide helps with ADLs, and provide supervision.  She is using a walker.  She has lost 10 pounds since last seen.  Memory is overall stable, MMSE 21/30 today.  She manages her meds.  She was enrolled in memory research, was told not a candidate, no tau plaque was seen.  Presents today for evaluation accompanied by her son, [name], and daughter is on the phone.  HISTORY 08/26/2019 SS: Deborah Moreno is an 79 year old female with history of progressive memory disturbance.  She lives with her son. She remains on Aricept and Namenda, she keeps up her medications.  She performs all her own ADLs.  She very rarely drives a car, has not had any issues thus far.  Her son works 2nd shift, she stays up until he gets home, goes to bed late, sleeps in.  Weight is staying about the same.  She denies any falls.  She said her appetite is good, does not do much cooking, uses microwave.  She enjoys reading.  She presents today for follow-up accompanied by her son, her daughter called in.  She feels her memory is staying about the same.  REVIEW OF SYSTEMS: Out of a complete 14 system review of symptoms, the patient complains only of the following symptoms, and all other reviewed systems are negative.  See HPI  ALLERGIES: Allergies  Allergen Reactions   Anesthetics, Halogenated Other (See Comments)    unknown   Lovastatin Other (See Comments)    MUSCLE ACHES    HOME MEDICATIONS: Outpatient Medications Prior to Visit  Medication Sig Dispense Refill   acetaminophen (TYLENOL)  500 MG tablet Take 1,000 mg by mouth every 6 (six) hours as needed for moderate pain.     albuterol (PROVENTIL HFA;VENTOLIN HFA) 108 (90 BASE) MCG/ACT inhaler Inhale 2 puffs into the lungs every 6 (six) hours as needed for wheezing or shortness of breath.      amLODipine (NORVASC) 2.5 MG tablet Take 2.5 mg by mouth daily.     celecoxib (CELEBREX) 200 MG capsule Take 200 mg by mouth 2 (two) times daily as needed for mild pain.      chlorhexidine (PERIDEX) 0.12 % solution Use as directed 7.5 mLs in the mouth or throat daily.      DENTAGEL 1.1 % GEL dental gel Place 1 application onto teeth daily.     donepezil (ARICEPT) 10 MG tablet Take 1 tablet (10 mg total) by mouth at bedtime. 90 tablet 1   FLUoxetine (PROZAC) 20 MG capsule Take 20 mg by mouth daily.     HYDROcodone-acetaminophen (NORCO) 5-325 MG tablet Take 1 tablet by mouth every 4 (four) hours as needed for severe pain (for severe pain not controlled by tramadol). 15 tablet 0   memantine (NAMENDA) 10 MG tablet TAKE 1 TABLET BY MOUTH TWICE A DAY 180 tablet 1   methocarbamol (ROBAXIN) 500 MG tablet Take 1 tablet (500 mg total) by mouth every 6 (six) hours as needed for muscle spasms. 50 tablet 1   ondansetron (ZOFRAN ODT) 4 MG  disintegrating tablet Take 1 tablet (4 mg total) by mouth every 8 (eight) hours as needed. 20 tablet 0   raloxifene (EVISTA) 60 MG tablet Take 60 mg by mouth daily.  12   RESTASIS 0.05 % ophthalmic emulsion Place 1 drop into both eyes 2 (two) times daily.   3   traMADol (ULTRAM) 50 MG tablet Take 50 mg by mouth every 6 (six) hours as needed for pain.     vitamin B-12 (CYANOCOBALAMIN) 1000 MCG tablet Take 1,000 mcg by mouth every 7 (seven) days.      Vitamin D, Ergocalciferol, (DRISDOL) 50000 units CAPS capsule Take 50,000 Units by mouth every 7 (seven) days.     No facility-administered medications prior to visit.    PAST MEDICAL HISTORY: Past Medical History:  Diagnosis Date   Atypical chest pain     Childhood asthma    Colon polyps    Degenerative arthritis    Dementia (HCC)    Depression    GERD (gastroesophageal reflux disease)    Hypertension    Left knee injury    DISLOCATION   Memory disorder 01/02/2014   PONV (postoperative nausea and vomiting)    Vitamin B12 deficiency     PAST SURGICAL HISTORY: Past Surgical History:  Procedure Laterality Date   CARPAL TUNNEL RELEASE Right 1995   COLONOSCOPY  2006   OCT   DILATION AND CURETTAGE OF UTERUS  1981   1990   HIP PINNING,CANNULATED Left 07/16/2016   Procedure: CANNULATED HIP PINNING;  Surgeon: Samson FredericBrian Swinteck, MD;  Location: [facility] OR;  Service: Orthopedics;  Laterality: Left;   REVERSE SHOULDER ARTHROPLASTY Right 11/01/2019   Procedure: REVERSE SHOULDER ARTHROPLASTY;  Surgeon: Francena HanlySupple, Kevin, MD;  Location: [facility] OR;  Service: Orthopedics;  Laterality: Right;  120min   TONSILLECTOMY AND ADENOIDECTOMY  1944   VAGINAL HYSTERECTOMY  2003   VOLAR GANGLION CYST     INJECTION    FAMILY HISTORY: Family History  Problem Relation Age of Onset   Breast cancer Mother    Parkinsonism Father    Alzheimer's disease Father    Dementia Father    Alcohol abuse Paternal Uncle     SOCIAL HISTORY: Social History   Socioeconomic History   Marital status: Widowed    Spouse name: Not on file   Number of children: 2   Years of education: COLLEGE-1   Highest education level: Not on file  Occupational History   Occupation: Retired   Occupation: CMA   Occupation: Designer, industrial/productLab Tech  Tobacco Use   Smoking status: Never Smoker   Smokeless tobacco: Never Used  Substance and Sexual Activity   Alcohol use: No    Alcohol/week: 0.0 standard drinks   Drug use: No   Sexual activity: Not Currently  Other Topics Concern   Not on file  Social History Narrative   Patient lives at home her son lives with her.   Retired.   Education CMA.    Right handed.   Caffeine Yes.   Social Determinants of Health    Financial Resource Strain:    Difficulty of Paying Living Expenses: Not on file  Food Insecurity:    Worried About Programme researcher, broadcasting/film/videounning Out of Food in the Last Year: Not on file   [employer] Out of Food in the Last Year: Not on file  Transportation Needs:    Lack of Transportation (Medical): Not on file   Lack of Transportation (Non-Medical): Not on file  Physical Activity:    Days of Exercise per Week:  Not on file   Minutes of Exercise per Session: Not on file  Stress:    Feeling of Stress : Not on file  Social Connections:    Frequency of Communication with Friends and Family: Not on file   Frequency of Social Gatherings with Friends and Family: Not on file   Attends Religious Services: Not on file   Active Member of Clubs or Organizations: Not on file   Attends Banker Meetings: Not on file   Marital Status: Not on file  Intimate Partner Violence:    Fear of Current or Ex-Partner: Not on file   Emotionally Abused: Not on file   Physically Abused: Not on file   Sexually Abused: Not on file   PHYSICAL EXAM  Vitals:   02/26/20 1123  BP: 134/63  Pulse: 62  Weight: 93 lb 12.8 oz (42.5 kg)  Height: 5' (1.524 m)   Body mass index is 18.32 kg/m.  Generalized: Well developed, in no acute distress  MMSE - Mini Mental State Exam 02/26/2020 08/26/2019 02/18/2019  Not completed: - - (No Data)  Orientation to time 1 2 2   Orientation to Place 3 4 3   Registration 3 3 3   Attention/ Calculation 5 2 1   Recall 1 2 2   Language- name 2 objects 2 2 2   Language- repeat 1 1 1   Language- follow 3 step command 3 3 2   Language- follow 3 step command-comments - - she held the paper in the air  Language- read & follow direction 1 1 1   Write a sentence 1 1 1   Copy design 0 1 1  Total score 21 22 19     Neurological examination  Mentation: Alert oriented to time, place, history taking is equally provided by the patient and her son. Follows all commands speech and language  fluent Cranial nerve II-XII: Pupils were equal round reactive to light. Extraocular movements were full, visual field were full on confrontational test. Facial sensation and strength were normal. Head turning and shoulder shrug  were normal and symmetric. Motor: Good strength in all extremities Sensory: Sensory testing is intact to soft touch on all 4 extremities. No evidence of extinction is noted.  Coordination: Cerebellar testing reveals good finger-nose-finger and heel-to-shin bilaterally.  Gait and station: Gait is somewhat cautious, but steady.  Can walk independently. Reflexes: Deep tendon reflexes are symmetric and normal bilaterally.   DIAGNOSTIC DATA (LABS, IMAGING, TESTING) - I reviewed patient records, labs, notes, testing and imaging myself where available.  Lab Results  Component Value Date   WBC 6.3 11/01/2019   HGB 13.1 11/01/2019   HCT 40.5 11/01/2019   MCV 96.9 11/01/2019   PLT 282 11/01/2019      Component Value Date/Time   NA 139 11/01/2019 0824   NA 144 07/25/2016 0000   K 2.8 (L) 11/01/2019 0824   CL 102 11/01/2019 0824   CO2 25 11/01/2019 0824   GLUCOSE 77 11/01/2019 0824   BUN 12 11/01/2019 0824   BUN 10 07/25/2016 0000   CREATININE 0.61 11/01/2019 0824   CALCIUM 8.7 (L) 11/01/2019 0824   GFRNONAA >60 11/01/2019 0824   GFRAA >60 11/01/2019 0824   No results found for: CHOL, HDL, LDLCALC, LDLDIRECT, TRIG, CHOLHDL No results found for: 07/27/2016 Lab Results  Component Value Date   VITAMINB12 484 01/02/2014   Lab Results  Component Value Date   TSH 2.140 01/02/2014   ASSESSMENT AND PLAN 79 y.o. year old female  has a past medical history  of Atypical chest pain, Childhood asthma, Colon polyps, Degenerative arthritis, Dementia (HCC), Depression, GERD (gastroesophageal reflux disease), Hypertension, Left knee injury, Memory disorder (01/02/2014), PONV (postoperative nausea and vomiting), and Vitamin B12 deficiency. here with:  1.  Memory  disturbance  -MMSE 21/30 today, remains overall stable  -Was not a candidate for memory research, found no tau plaques  -We will continue on Aricept and Namenda, may need to reduce dose of Aricept in the future if she continues to lose weight, family is aware of weight loss, are working on increasing frequency of meals  -Continue follow-up with PCP, follow-up here in 6 months or sooner if needed  I spent 30 minutes of face-to-face and non-face-to-face time with patient.  This included previsit chart review, lab review, study review, order entry, electronic health record documentation, patient education.  Margie Ege, AGNP-C, DNP 02/26/2020, 11:31 AM [facility] [address] [place], [place] [address] [phone]

## 2020-02-26 NOTE — Patient Instructions (Signed)
Continue current medications Monitor the weight at home, needs to increase a few lbs Memory is overall stable See you back in 6 months

## 2020-02-26 NOTE — Progress Notes (Signed)
I have read the note, and I agree with the clinical assessment and plan.  Arden Axon K Baron Parmelee   

## 2020-06-25 ENCOUNTER — Other Ambulatory Visit: Payer: Self-pay | Admitting: Neurology

## 2020-08-30 IMAGING — CR DG SHOULDER 2+V*R*
3 series · 3 of 3 positions shown · non-contrast
Comparison: None.

CLINICAL DATA: Fall while getting out of shower with right shoulder
pain.

EXAM:
RIGHT SHOULDER - 2+ VIEW

[x shoulder ap right (1 of 3)]
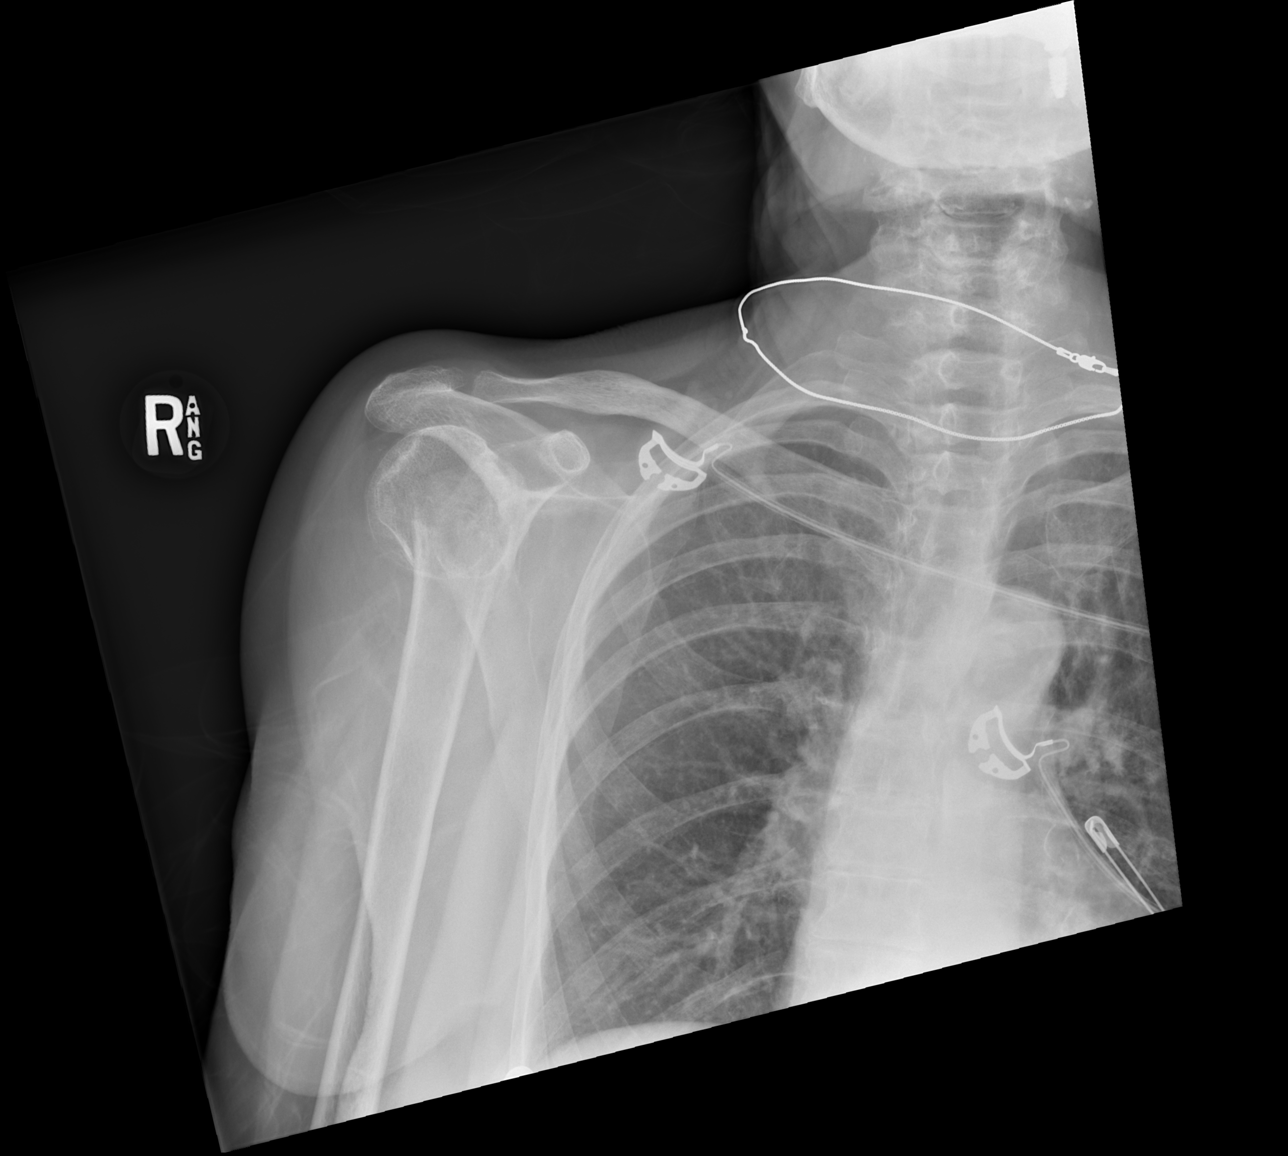

[x shoulder ap right (2 of 3)]
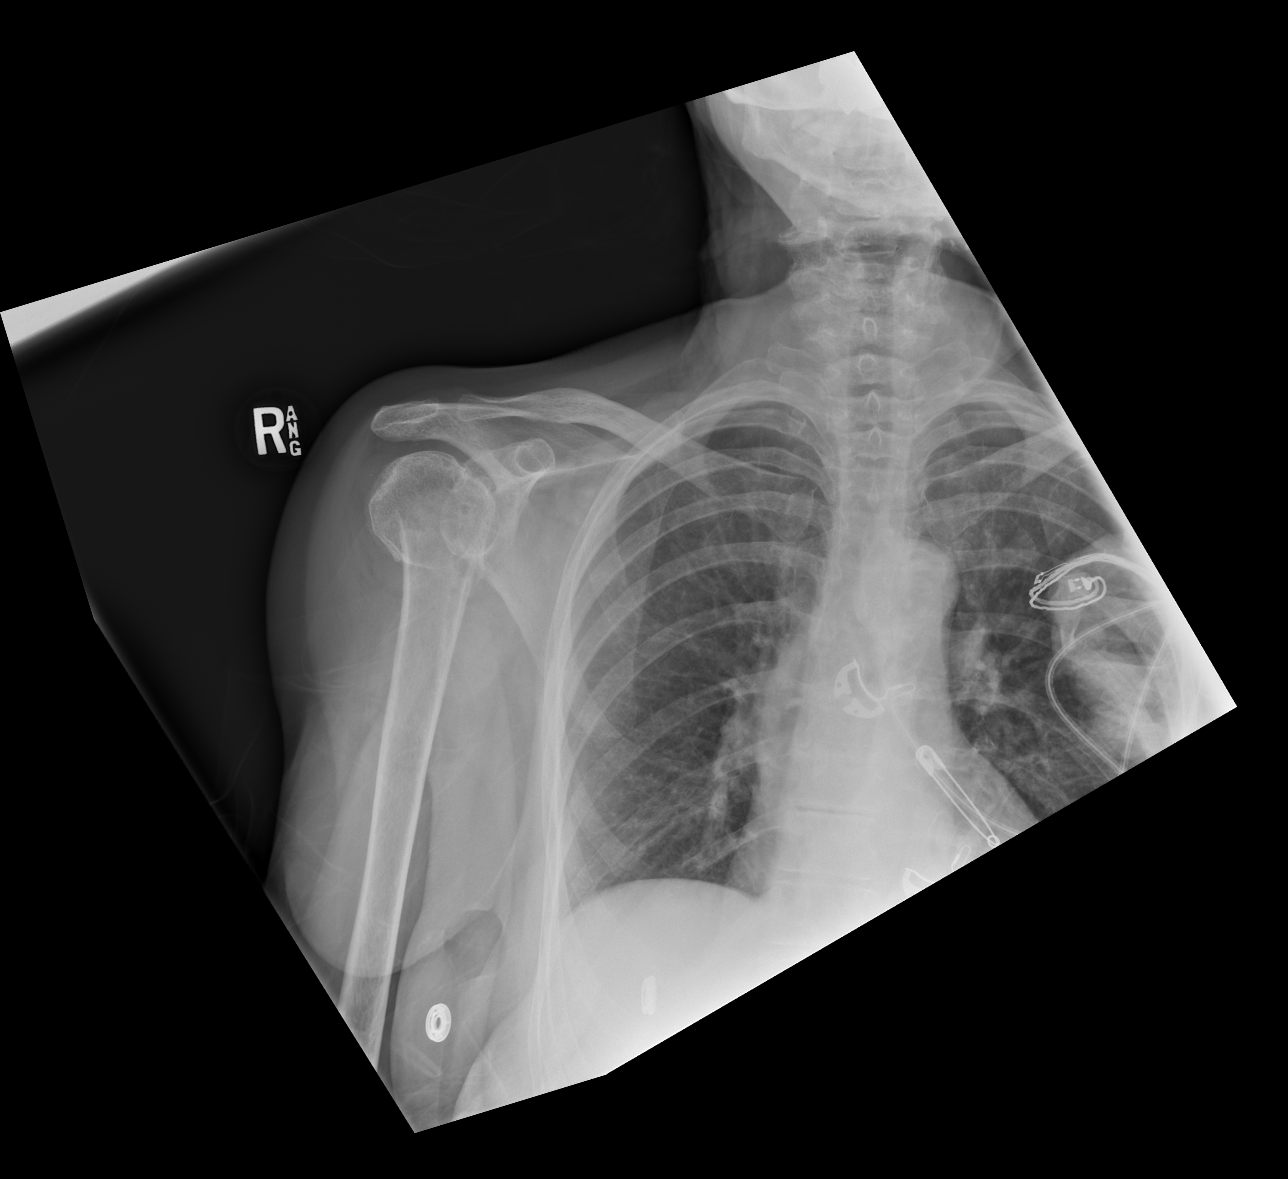

[x shoulder ap right (3 of 3)]
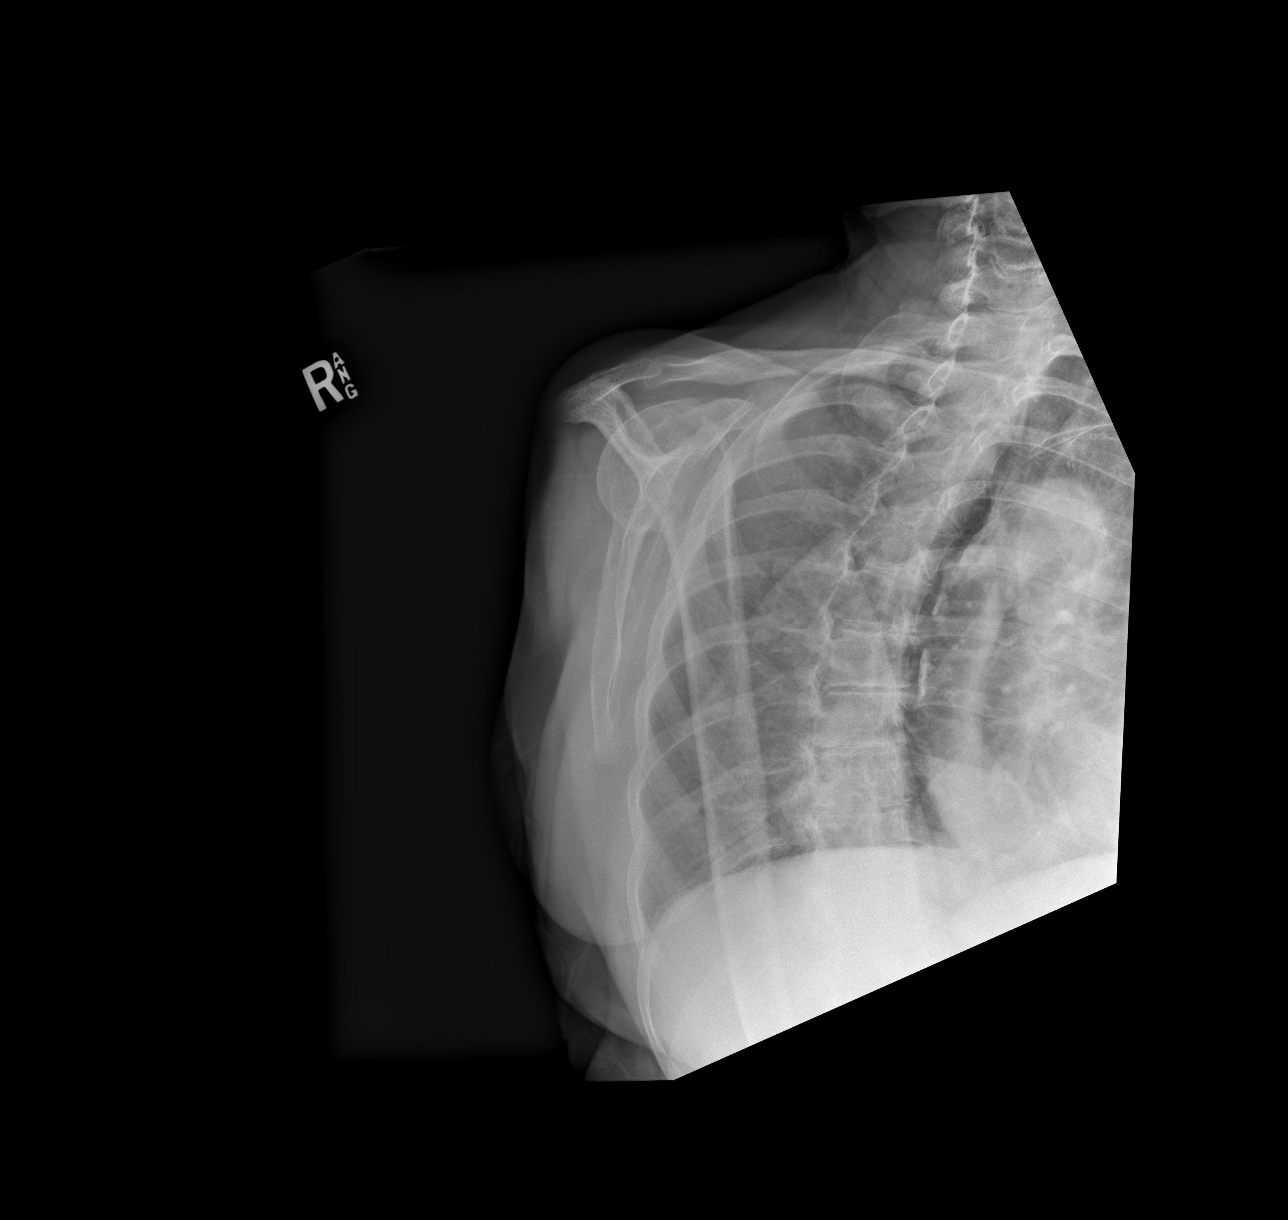

[3 of 3 positions shown; findings below may reference images not displayed]

FINDINGS: Exam demonstrates a minimally displaced right humeral neck fracture.
No evidence of dislocation.
IMPRESSION: Displaced humeral neck fracture.

## 2020-08-31 ENCOUNTER — Ambulatory Visit: Payer: Medicare Other | Admitting: Neurology

## 2020-08-31 ENCOUNTER — Encounter: Payer: Self-pay | Admitting: Neurology

## 2020-08-31 VITALS — BP 117/74 | HR 70 | Ht 60.0 in | Wt 98.0 lb

## 2020-08-31 DIAGNOSIS — R413 Other amnesia: Secondary | ICD-10-CM | POA: Diagnosis not present

## 2020-08-31 MED ORDER — MEMANTINE HCL 10 MG PO TABS: 10.0000 mg | ORAL_TABLET | Freq: Two times a day (BID) | ORAL | 1 refills | Status: AC

## 2020-08-31 NOTE — Progress Notes (Signed)
I have read the note, and I agree with the clinical assessment and plan.  Anjel Perfetti K Hernandez Losasso   

## 2020-08-31 NOTE — Progress Notes (Signed)
PATIENT: Deborah Moreno DOB: 05/18/1941  REASON FOR VISIT: follow up HISTORY FROM: patient  HISTORY OF PRESENT ILLNESS: Today 08/31/20 Deborah Moreno is an 80 year old female with history of progressive memory disturbance.  She lives with her son.  She remains on Aricept and Namenda.  Was enrolled in memory research, no tau plaque was seen. MMSE 24/30 today. Weight is up 5 lbs. No complaints today, Using a walker, no falls. Still has aide M-F, 1 PM-midnight. Her son keeps up with medications. Does her own ADLs. Eating well, sleeping well. No changes to health. Here today with son, Deborah Moreno. Doesn't drive a car.   Update 02/26/2020 SS: Deborah Moreno is an 80 year old female with history of progressive memory disturbance.  She lives with her son.  She remains on Aricept and Namenda.  She had a fall back in May, had to have right shoulder replacement.  Since then, she has had an in-home aide from 1 pm-midnight, while her son is working.  The aide helps with ADLs, and provide supervision.  She is using a walker.  She has lost 10 pounds since last seen.  Memory is overall stable, MMSE 21/30 today.  She manages her meds.  She was enrolled in memory research, was told not a candidate, no tau plaque was seen.  Presents today for evaluation accompanied by her son, Deborah Moreno, and daughter is on the phone.  HISTORY 08/26/2019 SS: Deborah Moreno is an 80 year old female with history of progressive memory disturbance.  She lives with her son. She remains on Aricept and Namenda, she keeps up her medications.  She performs all her own ADLs.  She very rarely drives a car, has not had any issues thus far.  Her son works 2nd shift, she stays up until he gets home, goes to bed late, sleeps in.  Weight is staying about the same.  She denies any falls.  She said her appetite is good, does not do much cooking, uses microwave.  She enjoys reading.  She presents today for follow-up accompanied by her son, her daughter called in.  She feels  her memory is staying about the same.  REVIEW OF SYSTEMS: Out of a complete 14 system review of symptoms, the patient complains only of the following symptoms, and all other reviewed systems are negative.  See HPI  ALLERGIES: Allergies  Allergen Reactions  . Anesthetics, Halogenated Other (See Comments)    unknown  . Lovastatin Other (See Comments)    MUSCLE ACHES    HOME MEDICATIONS: Outpatient Medications Prior to Visit  Medication Sig Dispense Refill  . acetaminophen (TYLENOL) 500 MG tablet Take 1,000 mg by mouth every 6 (six) hours as needed for moderate pain.    Marland Kitchen albuterol (PROVENTIL HFA;VENTOLIN HFA) 108 (90 BASE) MCG/ACT inhaler Inhale 2 puffs into the lungs every 6 (six) hours as needed for wheezing or shortness of breath.     Marland Kitchen amLODipine (NORVASC) 2.5 MG tablet Take 2.5 mg by mouth daily.    . celecoxib (CELEBREX) 200 MG capsule Take 200 mg by mouth 2 (two) times daily as needed for mild pain.     . chlorhexidine (PERIDEX) 0.12 % solution Use as directed 7.5 mLs in the mouth or throat daily.     . DENTAGEL 1.1 % GEL dental gel Place 1 application onto teeth daily.    Marland Kitchen donepezil (ARICEPT) 10 MG tablet TAKE 1 TABLET BY MOUTH EVERYDAY AT BEDTIME 90 tablet 1  . FLUoxetine (PROZAC) 20 MG capsule Take 20 mg  by mouth daily.    Marland Kitchen. HYDROcodone-acetaminophen (NORCO) 5-325 MG tablet Take 1 tablet by mouth every 4 (four) hours as needed for severe pain (for severe pain not controlled by tramadol). 15 tablet 0  . memantine (NAMENDA) 10 MG tablet TAKE 1 TABLET BY MOUTH TWICE A DAY 180 tablet 1  . raloxifene (EVISTA) 60 MG tablet Take 60 mg by mouth daily.  12  . RESTASIS 0.05 % ophthalmic emulsion Place 1 drop into both eyes 2 (two) times daily.   3  . vitamin B-12 (CYANOCOBALAMIN) 1000 MCG tablet Take 1,000 mcg by mouth every 7 (seven) days.     . Vitamin D, Ergocalciferol, (DRISDOL) 50000 units CAPS capsule Take 50,000 Units by mouth every 7 (seven) days.    . methocarbamol (ROBAXIN)  500 MG tablet Take 1 tablet (500 mg total) by mouth every 6 (six) hours as needed for muscle spasms. 50 tablet 1  . ondansetron (ZOFRAN ODT) 4 MG disintegrating tablet Take 1 tablet (4 mg total) by mouth every 8 (eight) hours as needed. 20 tablet 0  . traMADol (ULTRAM) 50 MG tablet Take 50 mg by mouth every 6 (six) hours as needed for pain.     No facility-administered medications prior to visit.    PAST MEDICAL HISTORY: Past Medical History:  Diagnosis Date  . Atypical chest pain   . Childhood asthma   . Colon polyps   . Degenerative arthritis   . Dementia (HCC)   . Depression   . GERD (gastroesophageal reflux disease)   . Hypertension   . Left knee injury    DISLOCATION  . 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 PONV (postoperative nausea and vomiting)   . Vitamin B12 deficiency     PAST SURGICAL HISTORY: Past Surgical History:  Procedure Laterality Date  . CARPAL TUNNEL RELEASE Right 1995  . COLONOSCOPY  2006   OCT  . DILATION AND CURETTAGE OF UTERUS  1981   1990  . HIP PINNING,CANNULATED Left 07/16/2016   Procedure: CANNULATED HIP PINNING;  Surgeon: Samson FredericBrian Swinteck, MD;  Location: [facility] OR;  Service: Orthopedics;  Laterality: Left;  . REVERSE SHOULDER ARTHROPLASTY Right 11/01/2019   Procedure: REVERSE SHOULDER ARTHROPLASTY;  Surgeon: Francena HanlySupple, Kevin, MD;  Location: [facility] OR;  Service: Orthopedics;  Laterality: Right;  120min  . TONSILLECTOMY AND ADENOIDECTOMY  1944  . VAGINAL HYSTERECTOMY  2003  . VOLAR GANGLION CYST     INJECTION    FAMILY HISTORY: Family History  Problem Relation Age of Onset  . Breast cancer Mother   . Parkinsonism Father   . Alzheimer's disease Father   . Dementia Father   . Alcohol abuse Paternal Uncle     SOCIAL HISTORY: Social History   Socioeconomic History  . Marital status: Widowed    Spouse name: Not on file  . Number of children: 2  . Years of education: COLLEGE-1  . Highest education level: Not on file  Occupational History  . Occupation:  Retired  . Occupation: CMA  . Occupation: [employer]  Tobacco Use  . Smoking status: Never Smoker  . Smokeless tobacco: Never Used  Substance and Sexual Activity  . Alcohol use: No    Alcohol/week: 0.0 standard drinks  . Drug use: No  . Sexual activity: Not Currently  Other Topics Concern  . Not on file  Social History Narrative   Patient lives at home her son lives with her.   Retired.   Education CMA.    Right handed.   Caffeine  Yes.   Social Determinants of Health   Financial Resource Strain: Not on file  Food Insecurity: Not on file  Transportation Needs: Not on file  Physical Activity: Not on file  Stress: Not on file  Social Connections: Not on file  Intimate Partner Violence: Not on file   PHYSICAL EXAM  Vitals:   08/31/20 1126  BP: 117/74  Pulse: 70  Weight: 98 lb (44.5 kg)  Height: 5' (1.524 m)   Body mass index is 19.14 kg/m.  Generalized: Well developed, in no acute distress  MMSE - Mini Mental State Exam 08/31/2020 02/26/2020 08/26/2019  Not completed: - - -  Orientation to time 4 1 2   Orientation to Place 5 3 4   Registration 3 3 3   Attention/ Calculation 1 5 2   Recall 2 1 2   Language- name 2 objects 2 2 2   Language- repeat 1 1 1   Language- follow 3 step command 3 3 3   Language- follow 3 step command-comments - - -  Language- read & follow direction 1 1 1   Write a sentence 1 1 1   Copy design 1 0 1  Total score 24 21 22     Neurological examination  Mentation: Alert oriented to time, place, history taking is equally provided by the patient and her son. Follows all commands speech and language fluent Cranial nerve II-XII: Pupils were equal round reactive to light. Extraocular movements were full, visual field were full on confrontational test. Facial sensation and strength were normal. Head turning and shoulder shrug  were normal and symmetric. Motor: Good strength in all extremities Sensory: Sensory testing is intact to soft touch on all 4  extremities. No evidence of extinction is noted.  Coordination: Cerebellar testing reveals good finger-nose-finger and heel-to-shin bilaterally.  Gait and station: Gait is somewhat cautious, but steady, forward leaning.  Can walk independently. Reflexes: Deep tendon reflexes are symmetric and normal bilaterally.   DIAGNOSTIC DATA (LABS, IMAGING, TESTING) - I reviewed patient records, labs, notes, testing and imaging myself where available.  Lab Results  Component Value Date   WBC 6.3 11/01/2019   HGB 13.1 11/01/2019   HCT 40.5 11/01/2019   MCV 96.9 11/01/2019   PLT 282 11/01/2019      Component Value Date/Time   NA 139 11/01/2019 0824   NA 144 07/25/2016 0000   K 2.8 (L) 11/01/2019 0824   CL 102 11/01/2019 0824   CO2 25 11/01/2019 0824   GLUCOSE 77 11/01/2019 0824   BUN 12 11/01/2019 0824   BUN 10 07/25/2016 0000   CREATININE 0.61 11/01/2019 0824   CALCIUM 8.7 (L) 11/01/2019 0824   GFRNONAA >60 11/01/2019 0824   GFRAA >60 11/01/2019 0824   No results found for: CHOL, HDL, LDLCALC, LDLDIRECT, TRIG, CHOLHDL No results found for: 07/27/2016 Lab Results  Component Value Date   VITAMINB12 484 01/02/2014   Lab Results  Component Value Date   TSH 2.140 01/02/2014   ASSESSMENT AND PLAN 80 y.o. year old female  has a past medical history of Atypical chest pain, Childhood asthma, Colon polyps, Degenerative arthritis, Dementia (HCC), Depression, GERD (gastroesophageal reflux disease), Hypertension, Left knee injury, Memory disorder (01/02/2014), PONV (postoperative nausea and vomiting), and Vitamin B12 deficiency. here with:  1.  Memory disturbance  -MMSE 24/30 today, remains overall stable  -Was not a candidate for memory research, found no tau plaques  -We will continue on Aricept and Namenda, weight is stable, is up 5 pounds  -Continue follow-up with PCP, follow-up at our  office on an as-needed basis  I spent 20 minutes of face-to-face and non-face-to-face time with patient.   This included previsit chart review, lab review, study review, order entry, electronic health record documentation, patient education.  Margie Ege, AGNP-C, DNP 08/31/2020, 11:36 AM [facility] [address] [place], [place] [address] [phone]

## 2020-08-31 NOTE — Patient Instructions (Signed)
Continue current medications PCP can refill  Return here as needed

## 2021-02-01 ENCOUNTER — Other Ambulatory Visit: Payer: Self-pay | Admitting: Neurology

## 2021-02-14 ENCOUNTER — Other Ambulatory Visit: Payer: Self-pay | Admitting: Neurology

## 2021-02-26 ENCOUNTER — Other Ambulatory Visit: Payer: Self-pay | Admitting: Neurology

## 2021-03-15 ENCOUNTER — Other Ambulatory Visit: Payer: Self-pay | Admitting: Neurology

## 2021-06-30 DIAGNOSIS — I10 Essential (primary) hypertension: Secondary | ICD-10-CM | POA: Diagnosis not present

## 2021-06-30 DIAGNOSIS — R609 Edema, unspecified: Secondary | ICD-10-CM | POA: Diagnosis not present

## 2021-07-19 DIAGNOSIS — L821 Other seborrheic keratosis: Secondary | ICD-10-CM | POA: Diagnosis not present

## 2021-07-19 DIAGNOSIS — B029 Zoster without complications: Secondary | ICD-10-CM | POA: Diagnosis not present

## 2021-07-19 DIAGNOSIS — L570 Actinic keratosis: Secondary | ICD-10-CM | POA: Diagnosis not present

## 2021-08-11 DIAGNOSIS — S83105D Unspecified dislocation of left knee, subsequent encounter: Secondary | ICD-10-CM | POA: Diagnosis not present

## 2021-08-11 DIAGNOSIS — K635 Polyp of colon: Secondary | ICD-10-CM | POA: Diagnosis not present

## 2021-08-11 DIAGNOSIS — H8110 Benign paroxysmal vertigo, unspecified ear: Secondary | ICD-10-CM | POA: Diagnosis not present

## 2021-08-11 DIAGNOSIS — J45909 Unspecified asthma, uncomplicated: Secondary | ICD-10-CM | POA: Diagnosis not present

## 2021-08-11 DIAGNOSIS — M25561 Pain in right knee: Secondary | ICD-10-CM | POA: Diagnosis not present

## 2021-08-11 DIAGNOSIS — F0390 Unspecified dementia without behavioral disturbance: Secondary | ICD-10-CM | POA: Diagnosis not present

## 2021-08-11 DIAGNOSIS — I10 Essential (primary) hypertension: Secondary | ICD-10-CM | POA: Diagnosis not present

## 2021-08-11 DIAGNOSIS — B029 Zoster without complications: Secondary | ICD-10-CM | POA: Diagnosis not present

## 2021-08-11 DIAGNOSIS — K219 Gastro-esophageal reflux disease without esophagitis: Secondary | ICD-10-CM | POA: Diagnosis not present

## 2022-01-17 DIAGNOSIS — M1711 Unilateral primary osteoarthritis, right knee: Secondary | ICD-10-CM | POA: Diagnosis not present

## 2022-02-01 DIAGNOSIS — N390 Urinary tract infection, site not specified: Secondary | ICD-10-CM | POA: Diagnosis not present

## 2022-02-10 DIAGNOSIS — H0100A Unspecified blepharitis right eye, upper and lower eyelids: Secondary | ICD-10-CM | POA: Diagnosis not present

## 2022-02-10 DIAGNOSIS — H5203 Hypermetropia, bilateral: Secondary | ICD-10-CM | POA: Diagnosis not present

## 2022-02-10 DIAGNOSIS — H11001 Unspecified pterygium of right eye: Secondary | ICD-10-CM | POA: Diagnosis not present

## 2022-02-10 DIAGNOSIS — H25813 Combined forms of age-related cataract, bilateral: Secondary | ICD-10-CM | POA: Diagnosis not present

## 2022-03-23 DIAGNOSIS — M8580 Other specified disorders of bone density and structure, unspecified site: Secondary | ICD-10-CM | POA: Diagnosis not present

## 2022-03-23 DIAGNOSIS — I10 Essential (primary) hypertension: Secondary | ICD-10-CM | POA: Diagnosis not present

## 2022-03-23 DIAGNOSIS — E785 Hyperlipidemia, unspecified: Secondary | ICD-10-CM | POA: Diagnosis not present

## 2022-03-23 DIAGNOSIS — E538 Deficiency of other specified B group vitamins: Secondary | ICD-10-CM | POA: Diagnosis not present

## 2022-03-23 DIAGNOSIS — R7989 Other specified abnormal findings of blood chemistry: Secondary | ICD-10-CM | POA: Diagnosis not present

## 2022-03-28 DIAGNOSIS — E785 Hyperlipidemia, unspecified: Secondary | ICD-10-CM | POA: Diagnosis not present

## 2022-03-28 DIAGNOSIS — Z0000 Encounter for general adult medical examination without abnormal findings: Secondary | ICD-10-CM | POA: Diagnosis not present

## 2022-03-28 DIAGNOSIS — M5136 Other intervertebral disc degeneration, lumbar region: Secondary | ICD-10-CM | POA: Diagnosis not present

## 2022-03-28 DIAGNOSIS — R2681 Unsteadiness on feet: Secondary | ICD-10-CM | POA: Diagnosis not present

## 2022-03-28 DIAGNOSIS — E46 Unspecified protein-calorie malnutrition: Secondary | ICD-10-CM | POA: Diagnosis not present

## 2022-03-28 DIAGNOSIS — I10 Essential (primary) hypertension: Secondary | ICD-10-CM | POA: Diagnosis not present

## 2022-03-28 DIAGNOSIS — E559 Vitamin D deficiency, unspecified: Secondary | ICD-10-CM | POA: Diagnosis not present

## 2022-03-28 DIAGNOSIS — Z23 Encounter for immunization: Secondary | ICD-10-CM | POA: Diagnosis not present

## 2022-03-28 DIAGNOSIS — G301 Alzheimer's disease with late onset: Secondary | ICD-10-CM | POA: Diagnosis not present

## 2022-03-28 DIAGNOSIS — R052 Subacute cough: Secondary | ICD-10-CM | POA: Diagnosis not present

## 2022-08-04 ENCOUNTER — Other Ambulatory Visit (HOSPITAL_COMMUNITY): Payer: Self-pay | Admitting: Family Medicine

## 2022-08-04 ENCOUNTER — Ambulatory Visit (HOSPITAL_COMMUNITY)
Admission: RE | Admit: 2022-08-04 | Discharge: 2022-08-04 | Disposition: A | Discharge status: Home / Self Care | Payer: Medicare HMO | Source: Ambulatory Visit | Attending: Family Medicine | Admitting: Family Medicine

## 2022-08-04 DIAGNOSIS — M7989 Other specified soft tissue disorders: Secondary | ICD-10-CM

## 2022-08-04 DIAGNOSIS — L03116 Cellulitis of left lower limb: Secondary | ICD-10-CM | POA: Diagnosis not present

## 2022-08-04 NOTE — Progress Notes (Signed)
Lower extremity venous left study completed.  Preliminary results left as voicemail for [facility], nurse for Philip Aspen, MD.   See CV Proc for preliminary results report.   Darlin Coco, RDMS, RVT

## 2022-10-14 DIAGNOSIS — S2232XA Fracture of one rib, left side, initial encounter for closed fracture: Secondary | ICD-10-CM | POA: Diagnosis not present

## 2022-10-14 DIAGNOSIS — R0781 Pleurodynia: Secondary | ICD-10-CM | POA: Diagnosis not present

## 2022-10-14 DIAGNOSIS — R058 Other specified cough: Secondary | ICD-10-CM | POA: Diagnosis not present

## 2022-10-14 DIAGNOSIS — E46 Unspecified protein-calorie malnutrition: Secondary | ICD-10-CM | POA: Diagnosis not present

## 2022-10-21 DIAGNOSIS — R82998 Other abnormal findings in urine: Secondary | ICD-10-CM | POA: Diagnosis not present

## 2022-10-21 DIAGNOSIS — R319 Hematuria, unspecified: Secondary | ICD-10-CM | POA: Diagnosis not present

## 2022-10-21 DIAGNOSIS — R0781 Pleurodynia: Secondary | ICD-10-CM | POA: Diagnosis not present

## 2022-10-21 DIAGNOSIS — S2232XA Fracture of one rib, left side, initial encounter for closed fracture: Secondary | ICD-10-CM | POA: Diagnosis not present

## 2022-10-21 DIAGNOSIS — N390 Urinary tract infection, site not specified: Secondary | ICD-10-CM | POA: Diagnosis not present

## 2022-10-21 DIAGNOSIS — E46 Unspecified protein-calorie malnutrition: Secondary | ICD-10-CM | POA: Diagnosis not present

## 2022-10-21 DIAGNOSIS — R058 Other specified cough: Secondary | ICD-10-CM | POA: Diagnosis not present

## 2023-04-04 DIAGNOSIS — Z1389 Encounter for screening for other disorder: Secondary | ICD-10-CM | POA: Diagnosis not present

## 2023-04-04 DIAGNOSIS — E785 Hyperlipidemia, unspecified: Secondary | ICD-10-CM | POA: Diagnosis not present

## 2023-04-04 DIAGNOSIS — E538 Deficiency of other specified B group vitamins: Secondary | ICD-10-CM | POA: Diagnosis not present

## 2023-04-04 DIAGNOSIS — M8580 Other specified disorders of bone density and structure, unspecified site: Secondary | ICD-10-CM | POA: Diagnosis not present

## 2023-04-04 DIAGNOSIS — E559 Vitamin D deficiency, unspecified: Secondary | ICD-10-CM | POA: Diagnosis not present

## 2023-04-04 DIAGNOSIS — I10 Essential (primary) hypertension: Secondary | ICD-10-CM | POA: Diagnosis not present

## 2023-04-05 DIAGNOSIS — H2513 Age-related nuclear cataract, bilateral: Secondary | ICD-10-CM | POA: Diagnosis not present

## 2023-04-05 DIAGNOSIS — H524 Presbyopia: Secondary | ICD-10-CM | POA: Diagnosis not present

## 2023-04-05 DIAGNOSIS — H11001 Unspecified pterygium of right eye: Secondary | ICD-10-CM | POA: Diagnosis not present

## 2023-04-11 DIAGNOSIS — Z1331 Encounter for screening for depression: Secondary | ICD-10-CM | POA: Diagnosis not present

## 2023-04-11 DIAGNOSIS — E559 Vitamin D deficiency, unspecified: Secondary | ICD-10-CM | POA: Diagnosis not present

## 2023-04-11 DIAGNOSIS — K219 Gastro-esophageal reflux disease without esophagitis: Secondary | ICD-10-CM | POA: Diagnosis not present

## 2023-04-11 DIAGNOSIS — Z1339 Encounter for screening examination for other mental health and behavioral disorders: Secondary | ICD-10-CM | POA: Diagnosis not present

## 2023-04-11 DIAGNOSIS — Z23 Encounter for immunization: Secondary | ICD-10-CM | POA: Diagnosis not present

## 2023-04-11 DIAGNOSIS — I10 Essential (primary) hypertension: Secondary | ICD-10-CM | POA: Diagnosis not present

## 2023-04-11 DIAGNOSIS — G301 Alzheimer's disease with late onset: Secondary | ICD-10-CM | POA: Diagnosis not present

## 2023-04-11 DIAGNOSIS — Z0000 Encounter for general adult medical examination without abnormal findings: Secondary | ICD-10-CM | POA: Diagnosis not present

## 2023-04-11 DIAGNOSIS — R2681 Unsteadiness on feet: Secondary | ICD-10-CM | POA: Diagnosis not present

## 2023-04-11 DIAGNOSIS — J4530 Mild persistent asthma, uncomplicated: Secondary | ICD-10-CM | POA: Diagnosis not present

## 2023-04-11 DIAGNOSIS — M7989 Other specified soft tissue disorders: Secondary | ICD-10-CM | POA: Diagnosis not present

## 2023-04-11 DIAGNOSIS — M25561 Pain in right knee: Secondary | ICD-10-CM | POA: Diagnosis not present

## 2023-04-14 DIAGNOSIS — M170 Bilateral primary osteoarthritis of knee: Secondary | ICD-10-CM | POA: Diagnosis not present

## 2023-04-14 DIAGNOSIS — M1711 Unilateral primary osteoarthritis, right knee: Secondary | ICD-10-CM | POA: Diagnosis not present

## 2023-05-05 DIAGNOSIS — M25561 Pain in right knee: Secondary | ICD-10-CM | POA: Diagnosis not present

## 2023-05-05 DIAGNOSIS — M6281 Muscle weakness (generalized): Secondary | ICD-10-CM | POA: Diagnosis not present

## 2023-05-05 DIAGNOSIS — M25562 Pain in left knee: Secondary | ICD-10-CM | POA: Diagnosis not present

## 2023-05-05 DIAGNOSIS — R269 Unspecified abnormalities of gait and mobility: Secondary | ICD-10-CM | POA: Diagnosis not present

## 2023-05-15 DIAGNOSIS — M25561 Pain in right knee: Secondary | ICD-10-CM | POA: Diagnosis not present

## 2023-05-15 DIAGNOSIS — R269 Unspecified abnormalities of gait and mobility: Secondary | ICD-10-CM | POA: Diagnosis not present

## 2023-05-15 DIAGNOSIS — M25562 Pain in left knee: Secondary | ICD-10-CM | POA: Diagnosis not present

## 2023-05-15 DIAGNOSIS — M6281 Muscle weakness (generalized): Secondary | ICD-10-CM | POA: Diagnosis not present

## 2023-05-18 DIAGNOSIS — M25562 Pain in left knee: Secondary | ICD-10-CM | POA: Diagnosis not present

## 2023-05-18 DIAGNOSIS — R269 Unspecified abnormalities of gait and mobility: Secondary | ICD-10-CM | POA: Diagnosis not present

## 2023-05-18 DIAGNOSIS — M6281 Muscle weakness (generalized): Secondary | ICD-10-CM | POA: Diagnosis not present

## 2023-05-18 DIAGNOSIS — M25561 Pain in right knee: Secondary | ICD-10-CM | POA: Diagnosis not present

## 2023-05-23 DIAGNOSIS — M6281 Muscle weakness (generalized): Secondary | ICD-10-CM | POA: Diagnosis not present

## 2023-05-23 DIAGNOSIS — R269 Unspecified abnormalities of gait and mobility: Secondary | ICD-10-CM | POA: Diagnosis not present

## 2023-05-23 DIAGNOSIS — M25562 Pain in left knee: Secondary | ICD-10-CM | POA: Diagnosis not present

## 2023-05-23 DIAGNOSIS — M25561 Pain in right knee: Secondary | ICD-10-CM | POA: Diagnosis not present

## 2023-05-30 DIAGNOSIS — M1711 Unilateral primary osteoarthritis, right knee: Secondary | ICD-10-CM | POA: Diagnosis not present

## 2023-05-31 DIAGNOSIS — R269 Unspecified abnormalities of gait and mobility: Secondary | ICD-10-CM | POA: Diagnosis not present

## 2023-05-31 DIAGNOSIS — M25561 Pain in right knee: Secondary | ICD-10-CM | POA: Diagnosis not present

## 2023-05-31 DIAGNOSIS — M6281 Muscle weakness (generalized): Secondary | ICD-10-CM | POA: Diagnosis not present

## 2023-05-31 DIAGNOSIS — M25562 Pain in left knee: Secondary | ICD-10-CM | POA: Diagnosis not present

## 2023-06-01 ENCOUNTER — Other Ambulatory Visit (HOSPITAL_COMMUNITY): Payer: Self-pay | Admitting: Family Medicine

## 2023-06-01 ENCOUNTER — Ambulatory Visit (HOSPITAL_BASED_OUTPATIENT_CLINIC_OR_DEPARTMENT_OTHER)
Admission: RE | Admit: 2023-06-01 | Discharge: 2023-06-01 | Disposition: A | Discharge status: Home / Self Care | Payer: Medicare HMO | Source: Ambulatory Visit | Attending: Vascular Surgery | Admitting: Vascular Surgery

## 2023-06-01 ENCOUNTER — Encounter (HOSPITAL_COMMUNITY): Payer: Self-pay | Admitting: Student-PharmD

## 2023-06-01 ENCOUNTER — Other Ambulatory Visit (HOSPITAL_COMMUNITY): Payer: Self-pay

## 2023-06-01 ENCOUNTER — Ambulatory Visit (HOSPITAL_COMMUNITY)
Admission: RE | Admit: 2023-06-01 | Discharge: 2023-06-01 | Disposition: A | Discharge status: Home / Self Care | Payer: Medicare HMO | Source: Ambulatory Visit | Attending: Internal Medicine | Admitting: Internal Medicine

## 2023-06-01 VITALS — BP 143/80 | HR 81

## 2023-06-01 DIAGNOSIS — M7989 Other specified soft tissue disorders: Secondary | ICD-10-CM

## 2023-06-01 DIAGNOSIS — E8770 Fluid overload, unspecified: Secondary | ICD-10-CM | POA: Diagnosis not present

## 2023-06-01 DIAGNOSIS — I82451 Acute embolism and thrombosis of right peroneal vein: Secondary | ICD-10-CM | POA: Insufficient documentation

## 2023-06-01 DIAGNOSIS — I82441 Acute embolism and thrombosis of right tibial vein: Secondary | ICD-10-CM | POA: Diagnosis not present

## 2023-06-01 MED ORDER — APIXABAN (ELIQUIS) VTE STARTER PACK (10MG AND 5MG)
ORAL_TABLET | ORAL | 0 refills | Status: DC
  Filled 2023-06-01: qty 74, 30d supply, fill #0

## 2023-06-01 NOTE — Patient Instructions (Addendum)
-  Start apixaban (Eliquis) 10 mg twice daily for 7 days followed by 5 mg twice daily.  -Follow up with your primary care doctor. Whether we feel that her mobility will improve or continue as is will be the determining factor for how long she needs to remain on Eliquis. This may need to be a risk/benefit discussion with your primary care doctor in terms of preventing future clot (benefit) versus risk of bleeding with a long-term anticoagulant.  -It is important to take your medication around the same time every day.  -Avoid NSAIDs like ibuprofen (Advil, Motrin) and naproxen (Aleve) as well as aspirin doses over 100 mg daily. -Tylenol (acetaminophen) is the preferred over the counter pain medication to lower the risk of bleeding. -Be sure to alert all of your health care providers that you are taking an anticoagulant prior to starting a new medication or having a procedure. -Monitor for signs and symptoms of bleeding (abnormal bruising, prolonged bleeding, nose bleeds, bleeding from gums, discolored urine, black tarry stools). If you have fallen and hit your head OR if your bleeding is severe or not stopping, seek emergency care.  -Go to the emergency room if emergent signs and symptoms of new clot occur (new or worse swelling and pain in an arm or leg, shortness of breath, chest pain, fast or irregular heartbeats, lightheadedness, dizziness, fainting, coughing up blood) or if you experience a significant color change (pale or blue) in the extremity that has the DVT.  -We recommend you wear compression stockings (20-30 mmHg) as long as you are having swelling or pain. Be sure to purchase the correct size and take them off at night.   Your next visit is on January 21st at 10am.  [facility] [address] [place], [place], [place] [address] Enter the hospital through Entrance C off [facility] and pull up to the [facility] entrance to the free valet parking.  Check in for  your appointment at the [facility].   If you have any questions or need to reschedule an appointment, please call [phone] [facility].  If you are having an emergency, call 911 or present to the nearest emergency room.   What is a DVT?  -Deep vein thrombosis (DVT) is a condition in which a blood clot forms in a vein of the deep venous system which can occur in the lower leg, thigh, pelvis, arm, or neck. This condition is serious and can be life-threatening if the clot travels to the arteries of the lungs and causing a blockage (pulmonary embolism, PE). A DVT can also damage veins in the leg, which can lead to long-term venous disease, leg pain, swelling, discoloration, and ulcers or sores (post-thrombotic syndrome).  -Treatment may include taking an anticoagulant medication to prevent more clots from forming and the current clot from growing, wearing compression stockings, and/or surgical procedures to remove or dissolve the clot.

## 2023-06-01 NOTE — Progress Notes (Signed)
DVT Clinic Note  Name: Deborah Moreno     MRN: [id number]     DOB: 04/11/1941     Sex: female  PCP: [name], MD  Today's Visit: Visit Information: Initial Visit  Referred to DVT Clinic by: Primary Care - Mcneil Sober, NP [facility]) Referred to CPP by: Dr. Lenell Antu Reason for referral:  Chief Complaint  Patient presents with   DVT   HISTORY OF PRESENT ILLNESS: Deborah Moreno is a 82 y.o. female with PMH HTN, HLD, angina, asthma, dementia, who presents after diagnosis of DVT for medication management. Patient was seen in her PCP's office today complaining of bilateral edema for the past few weeks. They prescribed a short course of furosemide and also ordered an ultrasound to rule out DVT given that her RLE edema was worse then left. This was positive for age indeterminate DVT in the right peroneal and posterior tibial veins, and she was referred to the DVT Clinic to initiate treatment.    Today, patient presents in a wheelchair accompanied by her son [name] who lives with her. He reports the swelling started about 1 month ago. She has a caregiver who comes during the day while he works. She has recently started going to [employer] for "memory daycare" during the day to help get her out of the house and have activities. Otherwise, she sits in a chair for most of the day. She has right knee pain that limits her mobility. Patient also has dementia. Her son reports that she has a bedside toilet and sometimes she will fall asleep sitting on the toilet and wake up because she has fallen off of it. No recent injuries, travel, illness. No history of VTE. Son reports that she has compression stockings.   Positive Thrombotic Risk Factors: Older Age, Other (comment) (prolonged periods of immobility) Bleeding Risk Factors: Age >65 years  Negative Thrombotic Risk Factors: Previous VTE, Recent surgery (within 3 months), Recent admission to hospital with acute illness (within 3 months),  Paralysis, paresis, or recent plaster cast immobilization of lower extremity, Sedentary journey lasting >8 hours within 4 weeks, Bed rest >72 hours within 3 months, Central venous catheterization, Pregnancy, Recent COVID diagnosis (within 3 months), Active cancer, Within 6 weeks postpartum, Recent cesarean section (within 3 months), Estrogen therapy, Testosterone therapy, Erythropoiesis-stimulating agent, Non-malignant, chronic inflammatory condition, Known thrombophilic condition, Smoking, Obesity, Recent trauma (within 3 months)  Rx Insurance Coverage: Medicare Rx Affordability: Eliquis is $45 for 30 day supply or $135 for 90 day Rx Assistance Provided: Free 30-day trial card  Past Medical History:  Diagnosis Date   Atypical chest pain    Childhood asthma    Colon polyps    Degenerative arthritis    Dementia (HCC)    Depression    GERD (gastroesophageal reflux disease)    Hypertension    Left knee injury    DISLOCATION   Memory disorder 01/02/2014   PONV (postoperative nausea and vomiting)    Vitamin B12 deficiency     Past Surgical History:  Procedure Laterality Date   CARPAL TUNNEL RELEASE Right 1995   COLONOSCOPY  2006   OCT   DILATION AND CURETTAGE OF UTERUS  1981   1990   HIP PINNING,CANNULATED Left 07/16/2016   Procedure: CANNULATED HIP PINNING;  Surgeon: Samson Frederic, MD;  Location: [facility] OR;  Service: Orthopedics;  Laterality: Left;   REVERSE SHOULDER ARTHROPLASTY Right 11/01/2019   Procedure: REVERSE SHOULDER ARTHROPLASTY;  Surgeon: Francena Hanly, MD;  Location: [facility] OR;  Service: Orthopedics;  Laterality: Right;    TONSILLECTOMY AND ADENOIDECTOMY  1944   VAGINAL HYSTERECTOMY  2003   VOLAR GANGLION CYST     INJECTION    Social History   Socioeconomic History   Marital status: Widowed    Spouse name: Not on file   Number of children: 2   Years of education: COLLEGE-1   Highest education level: Not on file  Occupational History   Occupation: Retired    Occupation: CMA   Occupation: Designer, industrial/product  Tobacco Use   Smoking status: Never   Smokeless tobacco: Never  Substance and Sexual Activity   Alcohol use: No    Alcohol/week: 0.0 standard drinks of alcohol   Drug use: No   Sexual activity: Not Currently  Other Topics Concern   Not on file  Social History Narrative   Patient lives at home her son lives with her.   Retired.   Education CMA.    Right handed.   Caffeine Yes.   Social Determinants of Health   Financial Resource Strain: Not on file  Food Insecurity: Not on file  Transportation Needs: Not on file  Physical Activity: Not on file  Stress: Not on file  Social Connections: Not on file  Intimate Partner Violence: Not on file    Family History  Problem Relation Age of Onset   Breast cancer Mother    Parkinsonism Father    Alzheimer's disease Father    Dementia Father    Alcohol abuse Paternal Uncle     Allergies as of 06/01/2023 - Review Complete 06/01/2023  Allergen Reaction Noted   Anesthetics, halogenated Other (See Comments) 12/31/2013   Lovastatin Other (See Comments) 12/31/2013    Current Outpatient Medications on File Prior to Encounter  Medication Sig Dispense Refill   acetaminophen (TYLENOL) 500 MG tablet Take 1,000 mg by mouth every 6 (six) hours as needed for moderate pain.     albuterol (PROVENTIL HFA;VENTOLIN HFA) 108 (90 BASE) MCG/ACT inhaler Inhale 2 puffs into the lungs every 6 (six) hours as needed for wheezing or shortness of breath.      amLODipine (NORVASC) 2.5 MG tablet Take 2.5 mg by mouth daily.     Budeson-Glycopyrrol-Formoterol 160-9-4.8 MCG/ACT AERO Inhale 1 puff into the lungs daily.     donepezil (ARICEPT) 10 MG tablet TAKE 1 TABLET BY MOUTH EVERYDAY AT BEDTIME 90 tablet 1   memantine (NAMENDA) 10 MG tablet Take 1 tablet (10 mg total) by mouth 2 (two) times daily. 180 tablet 1   raloxifene (EVISTA) 60 MG tablet Take 60 mg by mouth daily.  12   RESTASIS 0.05 % ophthalmic emulsion Place 1  drop into both eyes 2 (two) times daily.   3   vitamin B-12 (CYANOCOBALAMIN) 1000 MCG tablet Take 1,000 mcg by mouth every 7 (seven) days.      Vitamin D, Ergocalciferol, (DRISDOL) 50000 units CAPS capsule Take 50,000 Units by mouth every 7 (seven) days.     furosemide (LASIX) 20 MG tablet 1 tablet Orally Once a day for 3-5 days as directed for 30 days (Patient not taking: Reported on 06/01/2023)     No current facility-administered medications on file prior to encounter.   REVIEW OF SYSTEMS:  Review of Systems  Respiratory:  Negative for shortness of breath.   Cardiovascular:  Positive for leg swelling. Negative for chest pain and palpitations.  Neurological:  Negative for dizziness and tingling.   PHYSICAL EXAMINATION:  Vitals:   06/01/23 1629  BP: (!) 143/80  Pulse: 81  SpO2: 96%    Physical Exam Vitals reviewed.  Cardiovascular:     Rate and Rhythm: Normal rate.  Pulmonary:     Effort: Pulmonary effort is normal.  Musculoskeletal:        General: Tenderness present.     Right lower leg: Edema (2+; calf circumference larger than left) present.     Left lower leg: Edema (2+) present.  Skin:    Findings: No bruising or erythema.   Villalta Score for Post-Thrombotic Syndrome: Pain: Moderate Cramps: Moderate Heaviness: Mild Paresthesia: Absent Pruritus: Absent Pretibial Edema: Moderate Skin Induration: Absent Hyperpigmentation: Absent Redness: Mild Venous Ectasia: Absent Pain on calf compression: Mild Villalta Preliminary Score: 9 Is venous ulcer present?: No If venous ulcer is present and score is <15, then 15 points total are assigned: Absent Villalta Total Score: 9  LABS:  CBC     Component Value Date/Time   WBC 6.3 11/01/2019 0824   RBC 4.18 11/01/2019 0824   HGB 13.1 11/01/2019 0824   HCT 40.5 11/01/2019 0824   PLT 282 11/01/2019 0824   MCV 96.9 11/01/2019 0824   MCH 31.3 11/01/2019 0824   MCHC 32.3 11/01/2019 0824   RDW 12.7 11/01/2019 0824    LYMPHSABS 0.8 07/15/2016 0513   MONOABS 0.5 07/15/2016 0513   EOSABS 0.1 07/15/2016 0513   BASOSABS 0.0 07/15/2016 0513   Labs in [facility] from 04/04/23: Hgb 13.5, plt 275K, Scr 0.76, eGFR 78, AST 18, ALT 10  [facility] Vascular Lab Studies:  06/01/23 VAS [place] LOWER EXTREMITY VENOUS (DVT) RIGHT Summary:  RIGHT:  - Findings consistent with age indeterminate deep vein thrombosis  involving the right posterior tibial veins, and right peroneal veins.  - No cystic structure found in the popliteal fossa.    LEFT:  - No evidence of common femoral vein obstruction.   ASSESSMENT: Location of DVT: Right distal vein Cause of DVT: provoked by a persistent risk factor (immobility).   RLE significantly more edematous than left. No concerns on labs to start anticoagulation with Eliquis. She was provided with the starter pack during her visit today. Cost of refills is affordable for them. Educated son on proper elevation technique to help with her swelling. He will also have her wear compression stockings.   DVT appears to be provoked by long periods of immobility related to her age, functional status related to knee pain, and dementia. She has caregivers during the day while her son is at work but he admits the patient likely sits still for the majority of the day. She ambulates with a walker. In light of this likely continuing to be a persistent risk factor for her, she may benefit from long-term anticoagulation. This will involve a risk/benefit discussion that the patient's son would like to have with her PCP, weighing the risks of bleeding related to falls which her son reports at home versus the benefit of preventing future DVT. This is her first incidence of DVT and is distal. She needs anticoagulation for at least 3 months, and we will defer further risk/benefit discussions to primary care to determine the best option for her long-term. We do not feel she needs hypercoagulable work up with hematology.    PLAN: -Start apixaban (Eliquis) 10 mg twice daily for 7 days followed by 5 mg twice daily. -Expected duration of therapy: likely long-term given immobility is likely a persistent risk factor for her; decision on duration pending further benefit/risk discussions with PCP and family. Therapy  started on 06/01/23. -Patient educated on purpose, proper use and potential adverse effects of apixaban (Eliquis). -Discussed importance of taking medication around the same time every day. -Advised patient of medications to avoid (NSAIDs, aspirin doses >100 mg daily). -Educated that Tylenol (acetaminophen) is the preferred analgesic to lower the risk of bleeding. -Advised patient to alert all providers of anticoagulation therapy prior to starting a new medication or having a procedure. -Emphasized importance of monitoring for signs and symptoms of bleeding (abnormal bruising, prolonged bleeding, nose bleeds, bleeding from gums, discolored urine, black tarry stools). -Educated patient to present to the ED if emergent signs and symptoms of new thrombosis occur. -Counseled patient to wear compression stockings daily, removing at night.  Follow up: with primary care doctor. DVT Clinic in January.   Pervis Hocking, PharmD, [place]y [place], CPP [facility] Clinical Pharmacist Practitioner Office: [phone]

## 2023-06-06 DIAGNOSIS — M1711 Unilateral primary osteoarthritis, right knee: Secondary | ICD-10-CM | POA: Diagnosis not present

## 2023-06-13 DIAGNOSIS — M1711 Unilateral primary osteoarthritis, right knee: Secondary | ICD-10-CM | POA: Diagnosis not present

## 2023-06-27 ENCOUNTER — Other Ambulatory Visit (HOSPITAL_COMMUNITY): Payer: Self-pay | Admitting: Student-PharmD

## 2023-06-27 DIAGNOSIS — M25561 Pain in right knee: Secondary | ICD-10-CM | POA: Diagnosis not present

## 2023-06-27 DIAGNOSIS — R269 Unspecified abnormalities of gait and mobility: Secondary | ICD-10-CM | POA: Diagnosis not present

## 2023-06-27 DIAGNOSIS — M25562 Pain in left knee: Secondary | ICD-10-CM | POA: Diagnosis not present

## 2023-06-27 DIAGNOSIS — I82451 Acute embolism and thrombosis of right peroneal vein: Secondary | ICD-10-CM

## 2023-06-27 DIAGNOSIS — M6281 Muscle weakness (generalized): Secondary | ICD-10-CM | POA: Diagnosis not present

## 2023-06-27 MED ORDER — APIXABAN 5 MG PO TABS: 5.0000 mg | ORAL_TABLET | Freq: Two times a day (BID) | ORAL | 5 refills | Status: DC

## 2023-07-18 ENCOUNTER — Ambulatory Visit (HOSPITAL_COMMUNITY)
Admission: RE | Admit: 2023-07-18 | Discharge: 2023-07-18 | Disposition: A | Discharge status: Home / Self Care | Payer: Medicare Other | Source: Ambulatory Visit | Attending: Vascular Surgery | Admitting: Vascular Surgery

## 2023-07-18 ENCOUNTER — Other Ambulatory Visit (HOSPITAL_COMMUNITY): Payer: Self-pay

## 2023-07-18 DIAGNOSIS — I82451 Acute embolism and thrombosis of right peroneal vein: Secondary | ICD-10-CM

## 2023-07-18 NOTE — Progress Notes (Addendum)
DVT Clinic Note  Name: Deborah Moreno     MRN: [id number]     DOB: 11/03/1940     Sex: female  PCP: [name], MD  Today's Visit: Visit Information: Follow Up Visit  Referred to DVT Clinic by: Primary Care - Mcneil Sober, NP [facility]) Referred to CPP by: Dr. Randie Heinz Reason for referral:  Chief Complaint  Patient presents with   Med Management - DVT   HISTORY OF PRESENT ILLNESS: Deborah Moreno is a 83 y.o. 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 Last seen in DVT Clinic 06/01/23 at which time patient reported that her bilateral LEE had been going on for ~1 mo (R worse than L). She lives with her son [name].  Today patient reports that she is doing well. Is accompanied by her son, [name], and her daughter, [name]. Son reports she is still having bilateral LEE. LLE has gotten much better, but she continues to have RLE swelling. Denies abnormal bleeding or bruising. Denies missed doses of Eliquis - reports one incidence when she was a couple hours late taking the medication. She is wearing compression stockings daily. Son reports that she reports pain when putting on the compression stockings, which may be related to her ongoing knee pain. Denies any falls since starting the medication, though she has a history of falls in the past. Report that she may have upcoming teeth extraction and cataract surgery, which have not been scheduled yet. Her activity levels/daily mobility has remained relatively stable since last appt - son reports she may be moving a little more frequently as she is going to memory care twice/week. She continues to ambulate with a walker. Typical day includes ambulating to the couch in the morning, ambulating to the bathroom at least once, and then ambulating back to the bedroom at  night. She has been drinking water throughout the day. Denies chest pain, SOB, palpitations.   Positive Thrombotic Risk Factors: Older Age, Other (comment) (Prolonged periods of immobility) Bleeding Risk Factors: Age >65 years, Anticoagulant therapy  Negative Thrombotic Risk Factors: Previous VTE, Recent surgery (within 3 months), Recent trauma (within 3 months), Recent admission to hospital with acute illness (within 3 months), Central venous catheterization, Paralysis, paresis, or recent plaster cast immobilization of lower extremity, Bed rest >72 hours within 3 months, Sedentary journey lasting >8 hours within 4 weeks, Pregnancy, Estrogen therapy, Recent cesarean section (within 3 months), Within 6 weeks postpartum, Testosterone therapy, Erythropoiesis-stimulating agent, Recent COVID diagnosis (within 3 months), Known thrombophilic condition, Non-malignant, chronic inflammatory condition, Active cancer, Smoking, Obesity  Rx Insurance Coverage: Medicare Rx Affordability: Test claim 07/18/23 - $70.37 for 30 day supply. Patient/son report medication has been affordable Rx Assistance Provided: Free 30-day trial card provided at initial visit  [facility] [address]  Past Medical History:  Diagnosis Date   Atypical chest pain    Childhood asthma    Colon polyps    Degenerative arthritis    Dementia (HCC)    Depression    GERD (gastroesophageal reflux disease)    Hypertension    Left knee injury    DISLOCATION   Memory disorder 01/02/2014   PONV (postoperative nausea and vomiting)    Vitamin B12 deficiency     Past Surgical History:  Procedure Laterality Date   CARPAL TUNNEL RELEASE Right  1995   COLONOSCOPY  2006   OCT   DILATION AND CURETTAGE OF UTERUS  1981   1990   HIP PINNING,CANNULATED Left 07/16/2016   Procedure: CANNULATED HIP PINNING;  Surgeon: Samson Frederic, MD;  Location: [facility] OR;  Service: Orthopedics;  Laterality: Left;   REVERSE SHOULDER ARTHROPLASTY Right  11/01/2019   Procedure: REVERSE SHOULDER ARTHROPLASTY;  Surgeon: Francena Hanly, MD;  Location: [facility] OR;  Service: Orthopedics;  Laterality: Right;    TONSILLECTOMY AND ADENOIDECTOMY  1944   VAGINAL HYSTERECTOMY  2003   VOLAR GANGLION CYST     INJECTION    Social History   Socioeconomic History   Marital status: Widowed    Spouse name: Not on file   Number of children: 2   Years of education: COLLEGE-1   Highest education level: Not on file  Occupational History   Occupation: Retired   Occupation: CMA   Occupation: Designer, industrial/product  Tobacco Use   Smoking status: Never   Smokeless tobacco: Never  Substance and Sexual Activity   Alcohol use: No    Alcohol/week: 0.0 standard drinks of alcohol   Drug use: No   Sexual activity: Not Currently  Other Topics Concern   Not on file  Social History Narrative   Patient lives at home her son lives with her.   Retired.   Education CMA.    Right handed.   Caffeine Yes.   Social Drivers of Corporate investment banker Strain: Not on file  Food Insecurity: Not on file  Transportation Needs: Not on file  Physical Activity: Not on file  Stress: Not on file  Social Connections: Not on file  Intimate Partner Violence: Not on file    Family History  Problem Relation Age of Onset   Breast cancer Mother    Parkinsonism Father    Alzheimer's disease Father    Dementia Father    Alcohol abuse Paternal Uncle     Allergies as of 07/18/2023 - Review Complete 06/01/2023  Allergen Reaction Noted   Anesthetics, halogenated Other (See Comments) 12/31/2013   Lovastatin Other (See Comments) 12/31/2013    Current Outpatient Medications on File Prior to Encounter  Medication Sig Dispense Refill   acetaminophen (TYLENOL) 500 MG tablet Take 1,000 mg by mouth every 6 (six) hours as needed for moderate pain.     albuterol (PROVENTIL HFA;VENTOLIN HFA) 108 (90 BASE) MCG/ACT inhaler Inhale 2 puffs into the lungs every 6 (six) hours as needed for wheezing  or shortness of breath.      amLODipine (NORVASC) 2.5 MG tablet Take 2.5 mg by mouth daily.     apixaban (ELIQUIS) 5 MG TABS tablet Take 1 tablet (5 mg total) by mouth 2 (two) times daily. 60 tablet 5   Budeson-Glycopyrrol-Formoterol 160-9-4.8 MCG/ACT AERO Inhale 1 puff into the lungs daily.     donepezil (ARICEPT) 10 MG tablet TAKE 1 TABLET BY MOUTH EVERYDAY AT BEDTIME 90 tablet 1   furosemide (LASIX) 20 MG tablet 1 tablet Orally Once a day for 3-5 days as directed for 30 days (Patient not taking: Reported on 06/01/2023)     memantine (NAMENDA) 10 MG tablet Take 1 tablet (10 mg total) by mouth 2 (two) times daily. 180 tablet 1   raloxifene (EVISTA) 60 MG tablet Take 60 mg by mouth daily.  12   RESTASIS 0.05 % ophthalmic emulsion Place 1 drop into both eyes 2 (two) times daily.   3   vitamin B-12 (CYANOCOBALAMIN) 1000 MCG tablet  Take 1,000 mcg by mouth every 7 (seven) days.      Vitamin D, Ergocalciferol, (DRISDOL) 50000 units CAPS capsule Take 50,000 Units by mouth every 7 (seven) days.     No current facility-administered medications on file prior to encounter.   REVIEW OF SYSTEMS:  Review of Systems  HENT:  Negative for nosebleeds.   Respiratory:  Positive for cough (mild dry hacking cough). Negative for shortness of breath.   Cardiovascular:  Positive for leg swelling. Negative for chest pain and palpitations.  Genitourinary:  Negative for hematuria.  Musculoskeletal:  Positive for joint pain (R knee pain).  Neurological:  Negative for dizziness.  Psychiatric/Behavioral:  Positive for memory loss.    PHYSICAL EXAMINATION:  Physical Exam Constitutional:      Appearance: Normal appearance. She is normal weight.  Pulmonary:     Effort: Pulmonary effort is normal.  Musculoskeletal:     Right lower leg: Edema (2+ pitting) present.  Neurological:     Mental Status: She is alert.  Psychiatric:        Mood and Affect: Mood normal.        Behavior: Behavior normal.        Thought  Content: Thought content normal.   Villalta Score for Post-Thrombotic Syndrome: Pain: Mild Cramps: Mild Heaviness: Absent Paresthesia: Absent Pruritus: Absent Pretibial Edema: Moderate Skin Induration: Absent Hyperpigmentation: Absent Redness: Absent Venous Ectasia: Absent Pain on calf compression: Absent Villalta Preliminary Score: 4 Is venous ulcer present?: No If venous ulcer is present and score is <15, then 15 points total are assigned: Absent Villalta Total Score: 4  LABS:   Labs in [facility] from 04/04/23: Hgb 13.5, plt 275K, Scr 0.76, eGFR 78, AST 18, ALT 10  CBC     Component Value Date/Time   WBC 6.3 11/01/2019 0824   RBC 4.18 11/01/2019 0824   HGB 13.1 11/01/2019 0824   HCT 40.5 11/01/2019 0824   PLT 282 11/01/2019 0824   MCV 96.9 11/01/2019 0824   MCH 31.3 11/01/2019 0824   MCHC 32.3 11/01/2019 0824   RDW 12.7 11/01/2019 0824   LYMPHSABS 0.8 07/15/2016 0513   MONOABS 0.5 07/15/2016 0513   EOSABS 0.1 07/15/2016 0513   BASOSABS 0.0 07/15/2016 0513   Renal Function   Lab Results  Component Value Date   CREATININE 0.61 11/01/2019   CREATININE 0.7 07/25/2016   CREATININE 0.65 07/18/2016    [facility] Vascular Lab Studies:  06/01/23 VAS [place] LOWER EXTREMITY VENOUS (DVT) RIGHT Summary:  RIGHT:  - Findings consistent with age indeterminate deep vein thrombosis  involving the right posterior tibial veins, and right peroneal veins.  - No cystic structure found in the popliteal fossa.    LEFT:  - No evidence of common femoral vein obstruction.  ASSESSMENT: Location of DVT: Right distal vein Cause of DVT: provoked by a persistent risk factor  Pt with DVT thought to be provoked by long periods of immobility related to her age, functional status related to R knee pain, and dementia. RLE swelling is persistent, but pain has improved. This is her first incidence of DVT and is distal. Patient is tolerating the medication well and denies s/sx of bleeding. Will continue  treatment for at least 3 months. Because immobility appears to be a persistent risk factor, she may benefit from long-term anticoagulation or longer treatment duration of 6 months. Patient and son agreeable to attempt to increase mobility by ambulating every few hours. Patient has not had recent falls, but continues to be  at high bleeding risk due to age and unsteady gait. Will reevaluate mobility and plan for treatment duration at follow-up appt in ~6 weeks, which will be after 3 months of anticoagulation. Patient will continue to elevate legs and wear compression stockings to manage edema. Counseled to avoid any elective procedures that will require holding anticoagulation for the first 3 months of treatment, and advised caregivers that it is typically not required to hold anticoagulation for minor dental procedures or cataract surgery - though we are happy to provide clearance if requested.   PLAN: -Continue apixaban (Eliquis) 5 mg twice daily. -Expected duration of therapy: 3-6 months, though may benefit from long-term treatment with reduced dose Eliquis given persistent risk factor of immobility. Therapy started on 06/01/23. -Patient educated on purpose, proper use and potential adverse effects of apixaban (Eliquis). -Advised patient of medications to avoid (NSAIDs, aspirin doses >100 mg daily). -Educated that Tylenol (acetaminophen) is the preferred analgesic to lower the risk of bleeding. -Advised patient to alert all providers of anticoagulation therapy prior to starting a new medication or having a procedure. -Emphasized importance of monitoring for signs and symptoms of bleeding (abnormal bruising, prolonged bleeding, nose bleeds, bleeding from gums, discolored urine, black tarry stools). -Educated patient to present to the ED if emergent signs and symptoms of new thrombosis occur. -Counseled patient to wear compression stockings daily, removing at night. Continue elevating legs to help with  swelling.  Follow up: 08/29/23  Nils Pyle, PharmD PGY1 Pharmacy Resident  Pervis Hocking, PharmD, BCACP, CPP Deep [facility] Thrombosis [facility] Clinical Pharmacist Practitioner Office: [phone]

## 2023-07-18 NOTE — Patient Instructions (Addendum)
-  Continue apixaban (Eliquis) 5 mg twice daily. -[facility]. You may need to call the pharmacy to ask them to fill this when you start to run low on your current supply.  -It is important to take your medication around the same time every day.  -Avoid NSAIDs like ibuprofen (Advil, Motrin) and naproxen (Aleve) as well as aspirin doses over 100 mg daily. -Tylenol (acetaminophen) is the preferred over the counter pain medication to lower the risk of bleeding. -Be sure to alert all of your health care providers that you are taking an anticoagulant prior to starting a new medication or having a procedure. -Monitor for signs and symptoms of bleeding (abnormal bruising, prolonged bleeding, nose bleeds, bleeding from gums, discolored urine, black tarry stools). If you have fallen and hit your head OR if your bleeding is severe or not stopping, seek emergency care.  -Go to the emergency room if emergent signs and symptoms of new clot occur (new or worse swelling and pain in an arm or leg, shortness of breath, chest pain, fast or irregular heartbeats, lightheadedness, dizziness, fainting, coughing up blood) or if you experience a significant color change (pale or blue) in the extremity that has the DVT.  -We recommend you wear compression stockings as long as you are having swelling or pain. Be sure to purchase the correct size and take them off at night.   Your next visit is on Tuesday, March 4th at 10AM.  [facility] [address] [place], [place], [place] [address] Enter the hospital through Entrance C off [facility] and pull up to the [facility] entrance to the free valet parking.  Check in for your appointment at the [facility] V[facility].   If you have any questions or need to reschedule an appointment, please call [phone] [facility].  If you are having an emergency, call 911 or present to the nearest emergency room.    What is a DVT?  -Deep vein thrombosis (DVT) is a condition in which a blood clot forms in a vein of the deep venous system which can occur in the lower leg, thigh, pelvis, arm, or neck. This condition is serious and can be life-threatening if the clot travels to the arteries of the lungs and causing a blockage (pulmonary embolism, PE). A DVT can also damage veins in the leg, which can lead to long-term venous disease, leg pain, swelling, discoloration, and ulcers or sores (post-thrombotic syndrome).  -Treatment may include taking an anticoagulant medication to prevent more clots from forming and the current clot from growing, wearing compression stockings, and/or surgical procedures to remove or dissolve the clot.

## 2023-08-05 ENCOUNTER — Encounter (HOSPITAL_BASED_OUTPATIENT_CLINIC_OR_DEPARTMENT_OTHER): Payer: Self-pay

## 2023-08-05 ENCOUNTER — Other Ambulatory Visit: Payer: Self-pay

## 2023-08-05 ENCOUNTER — Emergency Department (HOSPITAL_BASED_OUTPATIENT_CLINIC_OR_DEPARTMENT_OTHER)
Admission: EM | Admit: 2023-08-05 | Discharge: 2023-08-05 | Disposition: A | Discharge status: Home / Self Care | Payer: Medicare Other | Attending: Emergency Medicine | Admitting: Emergency Medicine

## 2023-08-05 ENCOUNTER — Emergency Department (HOSPITAL_BASED_OUTPATIENT_CLINIC_OR_DEPARTMENT_OTHER): Payer: Medicare Other

## 2023-08-05 DIAGNOSIS — F0390 Unspecified dementia without behavioral disturbance: Secondary | ICD-10-CM | POA: Diagnosis not present

## 2023-08-05 DIAGNOSIS — I10 Essential (primary) hypertension: Secondary | ICD-10-CM | POA: Insufficient documentation

## 2023-08-05 DIAGNOSIS — R059 Cough, unspecified: Secondary | ICD-10-CM | POA: Diagnosis present

## 2023-08-05 DIAGNOSIS — J4489 Other specified chronic obstructive pulmonary disease: Secondary | ICD-10-CM | POA: Diagnosis not present

## 2023-08-05 DIAGNOSIS — Z79899 Other long term (current) drug therapy: Secondary | ICD-10-CM | POA: Insufficient documentation

## 2023-08-05 DIAGNOSIS — Z8709 Personal history of other diseases of the respiratory system: Secondary | ICD-10-CM | POA: Insufficient documentation

## 2023-08-05 DIAGNOSIS — J40 Bronchitis, not specified as acute or chronic: Secondary | ICD-10-CM | POA: Insufficient documentation

## 2023-08-05 DIAGNOSIS — K449 Diaphragmatic hernia without obstruction or gangrene: Secondary | ICD-10-CM | POA: Insufficient documentation

## 2023-08-05 DIAGNOSIS — Z20822 Contact with and (suspected) exposure to covid-19: Secondary | ICD-10-CM | POA: Insufficient documentation

## 2023-08-05 DIAGNOSIS — K648 Other hemorrhoids: Secondary | ICD-10-CM | POA: Insufficient documentation

## 2023-08-05 DIAGNOSIS — Z7901 Long term (current) use of anticoagulants: Secondary | ICD-10-CM | POA: Diagnosis not present

## 2023-08-05 DIAGNOSIS — Z86718 Personal history of other venous thrombosis and embolism: Secondary | ICD-10-CM | POA: Diagnosis not present

## 2023-08-05 DIAGNOSIS — Z96611 Presence of right artificial shoulder joint: Secondary | ICD-10-CM | POA: Insufficient documentation

## 2023-08-05 LAB — COMPREHENSIVE METABOLIC PANEL
ALT: 9 U/L (ref 0–44)
AST: 14 U/L — ABNORMAL LOW (ref 15–41)
Albumin: 4 g/dL (ref 3.5–5.0)
Alkaline Phosphatase: 64 U/L (ref 38–126)
Anion gap: 9 (ref 5–15)
BUN: 16 mg/dL (ref 8–23)
CO2: 28 mmol/L (ref 22–32)
Calcium: 9 mg/dL (ref 8.9–10.3)
Chloride: 103 mmol/L (ref 98–111)
Creatinine, Ser: 0.89 mg/dL (ref 0.44–1.00)
GFR, Estimated: >60 mL/min (ref >60)
Glucose, Bld: 110 mg/dL — ABNORMAL HIGH (ref 70–99)
Potassium: 4.3 mmol/L (ref 3.5–5.1)
Sodium: 140 mmol/L (ref 135–145)
Total Bilirubin: 0.2 mg/dL (ref 0.0–1.2)
Total Protein: 7.3 g/dL (ref 6.5–8.1)

## 2023-08-05 LAB — URINALYSIS, ROUTINE W REFLEX MICROSCOPIC
Bacteria, UA: NONE SEEN
Bilirubin Urine: NEGATIVE
Glucose, UA: NEGATIVE mg/dL
Ketones, ur: NEGATIVE mg/dL
Nitrite: NEGATIVE
Protein, ur: NEGATIVE mg/dL
Specific Gravity, Urine: 1.018 (ref 1.005–1.030)
pH: 5 (ref 5.0–8.0)

## 2023-08-05 LAB — CBC WITH DIFFERENTIAL/PLATELET
Abs Immature Granulocytes: 0.03 10*3/uL (ref 0.00–0.07)
Basophils Absolute: 0 10*3/uL (ref 0.0–0.1)
Basophils Relative: 1 %
Eosinophils Absolute: 0 10*3/uL (ref 0.0–0.5)
Eosinophils Relative: 0 %
HCT: 38.6 % (ref 36.0–46.0)
Hemoglobin: 12.3 g/dL (ref 12.0–15.0)
Immature Granulocytes: 1 %
Lymphocytes Relative: 15 %
Lymphs Abs: 0.9 10*3/uL (ref 0.7–4.0)
MCH: 30.8 pg (ref 26.0–34.0)
MCHC: 31.9 g/dL (ref 30.0–36.0)
MCV: 96.5 fL (ref 80.0–100.0)
Monocytes Absolute: 0.4 10*3/uL (ref 0.1–1.0)
Monocytes Relative: 7 %
Neutro Abs: 4.6 10*3/uL (ref 1.7–7.7)
Neutrophils Relative %: 76 %
Platelets: 366 10*3/uL (ref 150–400)
RBC: 4 MIL/uL (ref 3.87–5.11)
RDW: 13.2 % (ref 11.5–15.5)
WBC: 5.9 10*3/uL (ref 4.0–10.5)
nRBC: 0 % (ref 0.0–0.2)

## 2023-08-05 LAB — RESP PANEL BY RT-PCR (RSV, FLU A&B, COVID)  RVPGX2
Influenza A by PCR: NEGATIVE
Influenza B by PCR: NEGATIVE
Resp Syncytial Virus by PCR: NEGATIVE
SARS Coronavirus 2 by RT PCR: NEGATIVE

## 2023-08-05 LAB — BRAIN NATRIURETIC PEPTIDE: B Natriuretic Peptide: 22.5 pg/mL (ref 0.0–100.0)

## 2023-08-05 MED ORDER — PREDNISONE 50 MG PO TABS
60.0000 mg | ORAL_TABLET | Freq: Once | ORAL | Status: AC
  Administered 2023-08-05: 60 mg via ORAL
  Filled 2023-08-05: qty 1

## 2023-08-05 MED ORDER — IPRATROPIUM-ALBUTEROL 0.5-2.5 (3) MG/3ML IN SOLN
3.0000 mL | Freq: Once | RESPIRATORY_TRACT | Status: DC
  Filled 2023-08-05: qty 3

## 2023-08-05 MED ORDER — PREDNISONE 10 MG PO TABS: 40.0000 mg | ORAL_TABLET | Freq: Every day | ORAL | 0 refills | Status: AC

## 2023-08-05 MED ORDER — AZITHROMYCIN 250 MG PO TABS: 250.0000 mg | ORAL_TABLET | Freq: Every day | ORAL | 0 refills | Status: AC

## 2023-08-05 MED ORDER — FLUCONAZOLE 150 MG PO TABS
150.0000 mg | ORAL_TABLET | Freq: Once | ORAL | Status: AC
  Administered 2023-08-05: 150 mg via ORAL
  Filled 2023-08-05: qty 1

## 2023-08-05 NOTE — ED Provider Notes (Signed)
 [place] EMERGENCY DEPARTMENT AT [facility] Provider Note   CSN: [id number] Arrival date & time: 08/05/23  1338     History  Chief Complaint  Patient presents with   Cough    Deborah Moreno is a 83 y.o. female.  HPI     83yo female wit hhistory of dementia, htn, hlpd, age indeterminate DVT R peroneal and posterior tibial veins on 06/01/2023 on eliquis  5mg  BID who presents with concern for cough.    Cough for one month Was on antibiotics, completed them 1 week ago Home COVID test 2 days ago negative  Today had more severe episode of coughing Coughing up some yellow phlegm, not coughing up blood No CP or dyspnea  Has back up albuterol  inhaler, hx of asthma (used to be breZtri, now symbicort), albuerol prn Today had bad episode of coughing, wheezing had EMS called gave neb and said get checked out Used to smoke a long time ago but not regularly 65 yrs ago occasional, father was , parents were   Drug store was out of eliquis  about 4 days and out for 3 days and restarted again last night Son also coughing  No chest pain No fever, no nausea, vomiting, diarrhea Just abx week ago    Past Medical History:  Diagnosis Date   Atypical chest pain    Childhood asthma    Colon polyps    Degenerative arthritis    Dementia (HCC)    Depression    GERD (gastroesophageal reflux disease)    Hypertension    Left knee injury    DISLOCATION   Memory disorder 01/02/2014   PONV (postoperative nausea and vomiting)    Vitamin B12 deficiency      Home Medications Prior to Admission medications   Medication Sig Start Date End Date Taking? Authorizing Provider  azithromycin  (ZITHROMAX ) 250 MG tablet Take 1 tablet (250 mg total) by mouth daily. Take first 2 tablets together, then 1 every day until finished. 08/05/23  Yes Dreama Longs, MD  predniSONE  (DELTASONE ) 10 MG tablet Take 4 tablets (40 mg total) by mouth daily for 4 days. 08/05/23 08/09/23 Yes Dreama Longs, MD   acetaminophen  (TYLENOL ) 500 MG tablet Take 1,000 mg by mouth every 6 (six) hours as needed for moderate pain.    [provider]  albuterol  (PROVENTIL  HFA;VENTOLIN  HFA) 108 (90 BASE) MCG/ACT inhaler Inhale 2 puffs into the lungs every 6 (six) hours as needed for wheezing or shortness of breath.     [provider]  amLODipine  (NORVASC ) 2.5 MG tablet Take 2.5 mg by mouth daily. 06/12/19   [provider]  apixaban  (ELIQUIS ) 5 MG TABS tablet Take 1 tablet (5 mg total) by mouth 2 (two) times daily. 06/27/23   Barbarann Dixon B, RPH-CPP  Budeson-Glycopyrrol-Formoterol 160-9-4.8 MCG/ACT AERO Inhale 1 puff into the lungs daily. 04/12/23   [provider]  donepezil  (ARICEPT ) 10 MG tablet TAKE 1 TABLET BY MOUTH EVERYDAY AT BEDTIME 06/29/20   Willis, Charles K, MD  furosemide (LASIX) 20 MG tablet 1 tablet Orally Once a day for 3-5 days as directed for 30 days Patient not taking: Reported on 06/01/2023 06/01/23   [provider]  memantine  (NAMENDA ) 10 MG tablet Take 1 tablet (10 mg total) by mouth 2 (two) times daily. 08/31/20   Gayland Lauraine PARAS, NP  raloxifene  (EVISTA ) 60 MG tablet Take 60 mg by mouth daily. 03/29/17   [provider]  RESTASIS 0.05 % ophthalmic emulsion Place 1 drop into both  eyes 2 (two) times daily.  01/04/18   [provider]  vitamin B-12 (CYANOCOBALAMIN ) 1000 MCG tablet Take 1,000 mcg by mouth every 7 (seven) days.     [provider]  Vitamin D, Ergocalciferol, (DRISDOL) 50000 units CAPS capsule Take 50,000 Units by mouth every 7 (seven) days.    [provider]      Allergies    Anesthetics, halogenated and Lovastatin    Review of Systems   Review of Systems  Physical Exam Updated Vital Signs BP (!) 137/51   Pulse 76   Temp 98.5 F (36.9 C) (Oral)   Resp (!) 21   SpO2 100%  Physical Exam Vitals and nursing note reviewed.  Constitutional:      General: She is not in acute distress.    Appearance:  She is well-developed. She is not diaphoretic.  HENT:     Head: Normocephalic and atraumatic.  Eyes:     Conjunctiva/sclera: Conjunctivae normal.  Cardiovascular:     Rate and Rhythm: Normal rate and regular rhythm.     Heart sounds: Normal heart sounds. No murmur heard.    No friction rub. No gallop.  Pulmonary:     Effort: Pulmonary effort is normal. No respiratory distress.     Breath sounds: Normal breath sounds. No wheezing or rales.  Abdominal:     General: There is no distension.     Palpations: Abdomen is soft.     Tenderness: There is no abdominal tenderness. There is no guarding.  Genitourinary:    Comments: Hemorrhoid, mild vaginal/vulvar irritation/erythema Musculoskeletal:        General: No tenderness.     Cervical back: Normal range of motion.     Comments: Right lower extremity greater than left (known dvt)   Skin:    General: Skin is warm and dry.     Findings: No erythema or rash.  Neurological:     Mental Status: She is alert and oriented to person, place, and time.     ED Results / Procedures / Treatments   Labs (all labs ordered are listed, but only abnormal results are displayed) Labs Reviewed  COMPREHENSIVE METABOLIC PANEL - Abnormal; Notable for the following components:      Result Value   Glucose, Bld 110 (*)    AST 14 (*)    All other components within normal limits  URINALYSIS, ROUTINE W REFLEX MICROSCOPIC - Abnormal; Notable for the following components:   Hgb urine dipstick TRACE (*)    Leukocytes,Ua MODERATE (*)    All other components within normal limits  RESP PANEL BY RT-PCR (RSV, FLU A&B, COVID)  [id number]  CBC WITH DIFFERENTIAL/PLATELET  BRAIN NATRIURETIC PEPTIDE    EKG None  Radiology DG Chest Portable 1 View Result Date: 08/05/2023 CLINICAL DATA:  Cough and dyspnea EXAM: PORTABLE CHEST 1 VIEW COMPARISON:  None Available. FINDINGS: Right total shoulder arthroplasty is partially visualized. Moderate hiatal hernia. Normal heart  size. Otherwise normal mediastinal contour. No pneumothorax. No pleural effusion. Lungs appear clear, with no acute consolidative airspace disease and no pulmonary edema. IMPRESSION: 1. No active cardiopulmonary disease. 2. Moderate hiatal hernia. Electronically Signed   By: Selinda DELENA Blue M.D.   On: 08/05/2023 15:00    Procedures Procedures    Medications Ordered in ED Medications  fluconazole  (DIFLUCAN ) tablet 150 mg (has no administration in time range)  predniSONE  (DELTASONE ) tablet 60 mg (60 mg Oral Given 08/05/23 1601)    ED Course/ Medical Decision Making/ A&P  83yo female wit hhistory of dementia, htn, hlpd, age indeterminate DVT R peroneal and posterior tibial veins on 06/01/2023 on eliquis  5mg  BID who presents with concern for cough.   Differential diagnosis includes viral syndrome including influenza, COVID, RSV, pneumonia, CHF, PE, in setting of age and illness over the last month also consider possibility of anemia, other electrolyte abnormalities.  EKG completed and personally evaluated and interpreted by me in order to evaluate for signs of arrhythmia shows no acute abnormalities.  Chest x-ray abided by me and radiology shows no cardiomegaly, no pulmonary edema, no pneumonia or other acute abnormalities other than presence of hiatal hernia.  RSV, influenza and COVID testing were negative.  Given fatigue, labs were obtained and personally about interpreted by me show no sign of anemia, no leukocytosis, no transaminitis or AKI, no clinically significant electrolyte abnormalities.  BNP is normal and have low suspicion for congestive heart failure as etiology of her cough.  Urinalysis shows no sign of infection.  She does report some vaginal pruritus, has some erythema, feel is reasonable in setting of recent biotic use to cover her for yeast infection with a dose of fluconazole .  She is also noted to have hemorrhoids on exam.  Suspect likely  viral illness that shared with son with prolonged course in setting of asthma, also consider possibility of bacterial bronchitis or occult atypical pneumonia at point (vs possible change in sputum ?COPD with asthma//second hand smoke exposures) and feel it reasonable to give azithromycin  as well as prednisone  for asthma exacerbation.  Patient discharged in stable condition with understanding of reasons to return.          Final Clinical Impression(s) / ED Diagnoses Final diagnoses:  Hiatal hernia  History of asthma  Bronchitis    Rx / DC Orders ED Discharge Orders          Ordered    predniSONE  (DELTASONE ) 10 MG tablet  Daily        08/05/23 1715    azithromycin  (ZITHROMAX ) 250 MG tablet  Daily        08/05/23 1715              Dreama Longs, MD 08/05/23 1726

## 2023-08-05 NOTE — ED Triage Notes (Signed)
 Patient presents with productive cough +intermittent shortness of breath. Denies chest pain, nausea, vomiting, fever, chills

## 2023-08-18 NOTE — Progress Notes (Signed)
 ok

## 2023-08-28 NOTE — Progress Notes (Signed)
 DVT Clinic Note  Name: Deborah Moreno     MRN: [id number]     DOB: 05/24/1941     Sex: female  PCP: [name], MD  Today's Visit: Visit Information: Discharge Visit  Referred to DVT Clinic by: Primary Care - Mcneil Sober, NP [facility]) Referred to CPP by: Dr. Hetty Blend Reason for referral:  Chief Complaint  Patient presents with   Med Management - DVT   HISTORY OF PRESENT ILLNESS: Deborah Moreno is a 83 y.o. 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 Last seen in DVT Clinic 06/01/23 at which time patient reported that her bilateral LEE had been going on for ~1 mo (R worse than L). Eliquis was started at that time. She lives with her son [name]. Last seen in DVT Clinic 07/18/23 at which time her symptoms were improving and she was working on increasing mobility since this was her primary risk factor for DVT.   Today patient reports that her leg is feeling much better. Denies abnormal bleeding or bruising. Denies missed doses of Eliquis. Her current supply will run out tomorrow which would complete 3 months of treatment. She is wearing compression stockings bilaterally today and endorses wearing them daily. They have been working on increasing her mobility. She ambulated down the long hallway to our clinic today using her walker without issue. They also got her something similar to a recumbent bike with pedals that she can use to move her legs while still sitting in her chair. She continues to go to [employer] for memory daycare for activities.   Positive Thrombotic Risk Factors: Older Age, Other (comment) (prolonged periods of immobility) Bleeding Risk Factors: Age >65 years, Anticoagulant therapy  Negative Thrombotic Risk Factors: Previous VTE, Recent surgery (within 3 months), Recent trauma  (within 3 months), Recent admission to hospital with acute illness (within 3 months), Paralysis, paresis, or recent plaster cast immobilization of lower extremity, Central venous catheterization, Bed rest >72 hours within 3 months, Sedentary journey lasting >8 hours within 4 weeks, Pregnancy, Within 6 weeks postpartum, Recent cesarean section (within 3 months), Estrogen therapy, Testosterone therapy, Erythropoiesis-stimulating agent, Recent COVID diagnosis (within 3 months), Active cancer, Non-malignant, chronic inflammatory condition, Known thrombophilic condition, Smoking, Obesity  Rx Insurance Coverage: Medicare Rx Affordability: Test claim 07/18/23 - $70.37 for 30 day supply. Patient/son report medication has been affordable.  Rx Assistance Provided: Free 30-day trial card provided at initial visit  Preferred Pharmacy: [facility] [address]  Past Medical History:  Diagnosis Date   Atypical chest pain    Childhood asthma    Colon polyps    Degenerative arthritis    Dementia (HCC)    Depression    GERD (gastroesophageal reflux disease)    Hypertension    Left knee injury    DISLOCATION   Memory disorder 01/02/2014   PONV (postoperative nausea and vomiting)    Vitamin B12 deficiency     Past Surgical History:  Procedure Laterality Date   CARPAL TUNNEL RELEASE Right 1995   COLONOSCOPY  2006   OCT   DILATION AND CURETTAGE OF UTERUS  1981   1990   HIP PINNING,CANNULATED Left 07/16/2016   Procedure: CANNULATED HIP PINNING;  Surgeon: Samson Frederic, MD;  Location: [facility] OR;  Service: Orthopedics;  Laterality: Left;   REVERSE SHOULDER ARTHROPLASTY Right 11/01/2019  Procedure: REVERSE SHOULDER ARTHROPLASTY;  Surgeon: Francena Hanly, MD;  Location: [facility] OR;  Service: Orthopedics;  Laterality: Right;    TONSILLECTOMY AND ADENOIDECTOMY  1944   VAGINAL HYSTERECTOMY  2003   VOLAR GANGLION CYST     INJECTION    Social History   Socioeconomic History   Marital status: Widowed    Spouse name:  Not on file   Number of children: 2   Years of education: COLLEGE-1   Highest education level: Not on file  Occupational History   Occupation: Retired   Occupation: CMA   Occupation: Designer, industrial/product  Tobacco Use   Smoking status: Never   Smokeless tobacco: Never  Substance and Sexual Activity   Alcohol use: No    Alcohol/week: 0.0 standard drinks of alcohol   Drug use: No   Sexual activity: Not Currently  Other Topics Concern   Not on file  Social History Narrative   Patient lives at home her son lives with her.   Retired.   Education CMA.    Right handed.   Caffeine Yes.   Social Drivers of Corporate investment banker Strain: Not on file  Food Insecurity: Not on file  Transportation Needs: Not on file  Physical Activity: Not on file  Stress: Not on file  Social Connections: Not on file  Intimate Partner Violence: Not on file    Family History  Problem Relation Age of Onset   Breast cancer Mother    Parkinsonism Father    Alzheimer's disease Father    Dementia Father    Alcohol abuse Paternal Uncle     Allergies as of 08/29/2023 - Review Complete 08/29/2023  Allergen Reaction Noted   Anesthetics, halogenated Other (See Comments) 12/31/2013   Lovastatin Other (See Comments) 12/31/2013    Current Outpatient Medications on File Prior to Encounter  Medication Sig Dispense Refill   SYMBICORT 160-4.5 MCG/ACT inhaler Inhale 2 puffs into the lungs daily.     acetaminophen (TYLENOL) 500 MG tablet Take 1,000 mg by mouth every 6 (six) hours as needed for moderate pain.     albuterol (PROVENTIL HFA;VENTOLIN HFA) 108 (90 BASE) MCG/ACT inhaler Inhale 2 puffs into the lungs every 6 (six) hours as needed for wheezing or shortness of breath.      amLODipine (NORVASC) 2.5 MG tablet Take 2.5 mg by mouth daily.     azithromycin (ZITHROMAX) 250 MG tablet Take 1 tablet (250 mg total) by mouth daily. Take first 2 tablets together, then 1 every day until finished. 6 tablet 0   donepezil  (ARICEPT) 10 MG tablet TAKE 1 TABLET BY MOUTH EVERYDAY AT BEDTIME 90 tablet 1   furosemide (LASIX) 20 MG tablet 1 tablet Orally Once a day for 3-5 days as directed for 30 days (Patient not taking: Reported on 06/01/2023)     memantine (NAMENDA) 10 MG tablet Take 1 tablet (10 mg total) by mouth 2 (two) times daily. 180 tablet 1   raloxifene (EVISTA) 60 MG tablet Take 60 mg by mouth daily.  12   RESTASIS 0.05 % ophthalmic emulsion Place 1 drop into both eyes 2 (two) times daily.   3   vitamin B-12 (CYANOCOBALAMIN) 1000 MCG tablet Take 1,000 mcg by mouth every 7 (seven) days.      Vitamin D, Ergocalciferol, (DRISDOL) 50000 units CAPS capsule Take 50,000 Units by mouth every 7 (seven) days.     No current facility-administered medications on file prior to encounter.   REVIEW OF SYSTEMS:  Review of Systems  HENT:  Negative for nosebleeds.   Respiratory:  Negative for shortness of breath. Cough: mild dry hacking cough.  Cardiovascular:  Positive for leg swelling. Negative for chest pain and palpitations.  Genitourinary:  Negative for hematuria.  Musculoskeletal:  Positive for joint pain (R knee pain).  Neurological:  Negative for dizziness.  Psychiatric/Behavioral:  Positive for memory loss.    PHYSICAL EXAMINATION:  Physical Exam Constitutional:      Appearance: Normal appearance. She is normal weight.  Pulmonary:     Effort: Pulmonary effort is normal.  Musculoskeletal:        General: No tenderness.     Right lower leg: Edema (1+) present.     Left lower leg: Edema (trace) present.  Neurological:     Mental Status: She is alert.   Villalta Score for Post-Thrombotic Syndrome: Pain: Mild Cramps: Mild Heaviness: Mild Paresthesia: Absent Pruritus: Absent Pretibial Edema: Mild Skin Induration: Absent Hyperpigmentation: Absent Redness: Absent Venous Ectasia: Absent Pain on calf compression: Absent Villalta Preliminary Score: 4 Is venous ulcer present?: No If venous ulcer is  present and score is <15, then 15 points total are assigned: Absent Villalta Total Score: 4  LABS:   Labs in [facility] from 04/04/23: Hgb 13.5, plt 275K, Scr 0.76, eGFR 78, AST 18, ALT 10  CBC     Component Value Date/Time   WBC 5.9 08/05/2023 1600   RBC 4.00 08/05/2023 1600   HGB 12.3 08/05/2023 1600   HCT 38.6 08/05/2023 1600   PLT 366 08/05/2023 1600   MCV 96.5 08/05/2023 1600   MCH 30.8 08/05/2023 1600   MCHC 31.9 08/05/2023 1600   RDW 13.2 08/05/2023 1600   LYMPHSABS 0.9 08/05/2023 1600   MONOABS 0.4 08/05/2023 1600   EOSABS 0.0 08/05/2023 1600   BASOSABS 0.0 08/05/2023 1600   Renal Function   Lab Results  Component Value Date   CREATININE 0.89 08/05/2023   CREATININE 0.61 11/01/2019   CREATININE 0.7 07/25/2016    [facility] Vascular Lab Studies:  06/01/23 VAS [place] LOWER EXTREMITY VENOUS (DVT) RIGHT Summary:  RIGHT:  - Findings consistent with age indeterminate deep vein thrombosis  involving the right posterior tibial veins, and right peroneal veins.  - No cystic structure found in the popliteal fossa.   LEFT:  - No evidence of common femoral vein obstruction.  ASSESSMENT: Location of DVT: Right distal vein  Patient with first incidence of DVT involving the right posterior tibial and peroneal veins diagnosed 06/01/23, felt to be provoked by long periods of immobility related to her age, dementia, and functional status in the time leading up to her diagnosis. RLE edema and pain have improved. She does still have some edema in both legs, right worse than left, and she continues to wear compression stockings daily for this. They have been working on increasing her mobility with walking as well as using a pedaling device while seated. She has now completed 3 months of anticoagulation with Eliquis. Had risk benefit discussion with son [name] and daughter [name] of continuing anticoagulation versus discontinuing at this time. Patient's bleeding risks include her fall risk related to her  age and dementia. With this being her first incidence of DVT and it being only distal, it would be reasonable to stop anticoagulation after 3 months and monitor for recurrence. If she were to develop recurrent DVT, she would likely need indefinite anticoagulation at that time. Both her son and daughter agree that they would like to have her stop anticoagulation  because they feel that her risk of bleeding outweighs the benefit of staying on anticoagulation at this time and they will monitor her closely. I think this is reasonable. They will continue to increase her mobility and stay hydrated. Counseled them on signs and symptoms of DVT to watch for. All of their questions have been answered at this time.  PLAN: -Discontinue Eliquis after completing tomorrow's doses, as this will complete 3 months of anticoagulation (06/01/23-08/30/23) which aligns with when they will run out of current medication on hand. -Counseled patient and children on risk of recurrent VTE and signs/symptoms to watch for.  -Continue wearing compression stockings and elevating legs to help with swelling. -Continue to limit long periods of immobility, move around regularly, and stay well hydrated.   Follow up: No further follow up needed in DVT Clinic at this time but available if needed.  Pervis Hocking, PharmD, [place]y [place], CPP Deep Vein Thrombosis Clinic Clinical Pharmacist Practitioner Office: [phone]

## 2023-08-29 ENCOUNTER — Ambulatory Visit (HOSPITAL_COMMUNITY)
Admission: RE | Admit: 2023-08-29 | Discharge: 2023-08-29 | Disposition: A | Discharge status: Home / Self Care | Payer: Medicare Other | Source: Ambulatory Visit | Attending: Vascular Surgery | Admitting: Vascular Surgery

## 2023-08-29 DIAGNOSIS — I82451 Acute embolism and thrombosis of right peroneal vein: Secondary | ICD-10-CM | POA: Insufficient documentation

## 2023-08-29 NOTE — Patient Instructions (Signed)
 You have been discharged from the DVT Clinic! No further follow up in the DVT Clinic is needed.  -Finish taking Eliquis today and tomorrow, then discontinue treatment as you have completed 3 months of anticoagulation.  -Continue to work on moving regularly and staying hydrated. Keep a watch for new signs and symptoms of DVT.   Please reach out if any questions come up. [phone] [facility].

## 2024-07-02 ENCOUNTER — Other Ambulatory Visit: Payer: Self-pay

## 2024-07-02 ENCOUNTER — Encounter (HOSPITAL_COMMUNITY): Payer: Self-pay

## 2024-07-02 ENCOUNTER — Emergency Department (HOSPITAL_COMMUNITY)
Admission: EM | Admit: 2024-07-02 | Discharge: 2024-07-03 | Disposition: A | Discharge status: Home / Self Care | Attending: Emergency Medicine | Admitting: Emergency Medicine

## 2024-07-02 ENCOUNTER — Emergency Department (HOSPITAL_COMMUNITY)

## 2024-07-02 DIAGNOSIS — R0789 Other chest pain: Secondary | ICD-10-CM

## 2024-07-02 DIAGNOSIS — J45909 Unspecified asthma, uncomplicated: Secondary | ICD-10-CM | POA: Diagnosis not present

## 2024-07-02 DIAGNOSIS — K449 Diaphragmatic hernia without obstruction or gangrene: Secondary | ICD-10-CM | POA: Insufficient documentation

## 2024-07-02 LAB — TROPONIN T, HIGH SENSITIVITY
Troponin T High Sensitivity: 15 ng/L (ref 0–19)
Troponin T High Sensitivity: 17 ng/L (ref 0–19)

## 2024-07-02 LAB — COMPREHENSIVE METABOLIC PANEL WITH GFR
ALT: 9 U/L (ref 0–44)
AST: 21 U/L (ref 15–41)
Albumin: 3.9 g/dL (ref 3.5–5.0)
Alkaline Phosphatase: 89 U/L (ref 38–126)
Anion gap: 13 (ref 5–15)
BUN: 17 mg/dL (ref 8–23)
CO2: 25 mmol/L (ref 22–32)
Calcium: 9.3 mg/dL (ref 8.9–10.3)
Chloride: 102 mmol/L (ref 98–111)
Creatinine, Ser: 0.94 mg/dL (ref 0.44–1.00)
GFR, Estimated: 60 mL/min — ABNORMAL LOW
Glucose, Bld: 118 mg/dL — ABNORMAL HIGH (ref 70–99)
Potassium: 4.6 mmol/L (ref 3.5–5.1)
Sodium: 140 mmol/L (ref 135–145)
Total Bilirubin: 0.2 mg/dL (ref 0.0–1.2)
Total Protein: 7.1 g/dL (ref 6.5–8.1)

## 2024-07-02 LAB — CBC
HCT: 41.7 % (ref 36.0–46.0)
Hemoglobin: 13.2 g/dL (ref 12.0–15.0)
MCH: 31.4 pg (ref 26.0–34.0)
MCHC: 31.7 g/dL (ref 30.0–36.0)
MCV: 99 fL (ref 80.0–100.0)
Platelets: 323 K/uL (ref 150–400)
RBC: 4.21 MIL/uL (ref 3.87–5.11)
RDW: 14.2 % (ref 11.5–15.5)
WBC: 6.4 K/uL (ref 4.0–10.5)
nRBC: 0 % (ref 0.0–0.2)

## 2024-07-02 LAB — LIPASE, BLOOD: Lipase: 71 U/L — ABNORMAL HIGH (ref 11–51)

## 2024-07-02 LAB — MAGNESIUM: Magnesium: 2.2 mg/dL (ref 1.7–2.4)

## 2024-07-02 MED ORDER — SYMBICORT 160-4.5 MCG/ACT IN AERO: 2.0000 | INHALATION_SPRAY | Freq: Every day | RESPIRATORY_TRACT | 1 refills | Status: DC

## 2024-07-02 MED ORDER — ASPIRIN 81 MG PO CHEW
324.0000 mg | CHEWABLE_TABLET | Freq: Once | ORAL | Status: AC
  Administered 2024-07-02: 324 mg via ORAL
  Filled 2024-07-02: qty 4

## 2024-07-02 MED ORDER — FAMOTIDINE IN NACL 20-0.9 MG/50ML-% IV SOLN
20.0000 mg | Freq: Once | INTRAVENOUS | Status: AC
  Administered 2024-07-02: 20 mg via INTRAVENOUS
  Filled 2024-07-02: qty 50

## 2024-07-02 NOTE — ED Provider Notes (Signed)
 " [place] EMERGENCY DEPARTMENT AT [facility] Provider Note   CSN: [id number] Arrival date & time: 07/02/24  1948     Patient presents with: Chest Pain   Deborah Moreno is a 84 y.o. female.   HPI Patient with dementia presents with her caregiver. Patient is a former OB/GYN cma. Patient has a history of GERD, hiatal hernia. No noted history of cardiac disease. Today, while eating patient felt left-sided chest discomfort, but with smiling, continued eating throughout according to caregiver.  EMS reports patient was awake and alert in transport.     Prior to Admission medications  Medication Sig Start Date End Date Taking? Authorizing Provider  acetaminophen  (TYLENOL ) 500 MG tablet Take 1,000 mg by mouth every 6 (six) hours as needed for moderate pain.    [provider]  albuterol  (PROVENTIL  HFA;VENTOLIN  HFA) 108 (90 BASE) MCG/ACT inhaler Inhale 2 puffs into the lungs every 6 (six) hours as needed for wheezing or shortness of breath.     [provider]  amLODipine  (NORVASC ) 2.5 MG tablet Take 2.5 mg by mouth daily. 06/12/19   [provider]  azithromycin  (ZITHROMAX ) 250 MG tablet Take 1 tablet (250 mg total) by mouth daily. Take first 2 tablets together, then 1 every day until finished. 08/05/23   Dreama Longs, MD  donepezil  (ARICEPT ) 10 MG tablet TAKE 1 TABLET BY MOUTH EVERYDAY AT BEDTIME 06/29/20   Willis, Charles K, MD  furosemide (LASIX) 20 MG tablet 1 tablet Orally Once a day for 3-5 days as directed for 30 days Patient not taking: Reported on 06/01/2023 06/01/23   [provider]  memantine  (NAMENDA ) 10 MG tablet Take 1 tablet (10 mg total) by mouth 2 (two) times daily. 08/31/20   Gayland Lauraine PARAS, NP  raloxifene  (EVISTA ) 60 MG tablet Take 60 mg by mouth daily. 03/29/17   [provider]  RESTASIS 0.05 % ophthalmic emulsion Place 1 drop into both eyes 2 (two) times daily.  01/04/18   [provider]  SYMBICORT  160-4.5  MCG/ACT inhaler Inhale 2 puffs into the lungs daily. 06/06/23   [provider]  vitamin B-12 (CYANOCOBALAMIN ) 1000 MCG tablet Take 1,000 mcg by mouth every 7 (seven) days.     [provider]  Vitamin D, Ergocalciferol, (DRISDOL) 50000 units CAPS capsule Take 50,000 Units by mouth every 7 (seven) days.    [provider]    Allergies: Anesthetics, halogenated and Lovastatin    Review of Systems  Updated Vital Signs BP (!) 155/92 (BP Location: Left Arm)   Pulse 100   Temp 98.1 F (36.7 C) (Oral)   Resp (!) 30   Wt 59 kg   SpO2 100%   BMI 25.39 kg/m   Physical Exam Vitals and nursing note reviewed.  Constitutional:      General: She is not in acute distress.    Appearance: She is well-developed.  HENT:     Head: Normocephalic and atraumatic.  Eyes:     Conjunctiva/sclera: Conjunctivae normal.  Cardiovascular:     Rate and Rhythm: Normal rate and regular rhythm.  Pulmonary:     Effort: Pulmonary effort is normal. No respiratory distress.     Breath sounds: Normal breath sounds. No stridor.  Abdominal:     General: There is no distension.  Skin:    General: Skin is warm and dry.  Neurological:     Mental Status: She is alert.     Cranial Nerves: No cranial nerve deficit.  Psychiatric:  Mood and Affect: Mood normal.        Cognition and Memory: Memory is impaired.     (all labs ordered are listed, but only abnormal results are displayed) Labs Reviewed - No data to display  EKG: EKG Interpretation Date/Time:  Tuesday July 02 2024 19:55:20 EST Ventricular Rate:  103 PR Interval:  118 QRS Duration:  69 QT Interval:  336 QTC Calculation: 440 R Axis:   40  Text Interpretation: Sinus tachycardia Atrial premature complex Low voltage, precordial leads Borderline T abnormalities, inferior leads Confirmed by Garrick Charleston [phone]) on 07/02/2024 7:59:32 PM  Radiology: No results found.   Procedures   Medications Ordered in the ED  - No data to display                                  Medical Decision Making Elderly female with history of asthma, DVT, hiatal hernia, GERD presents with chest pain. Patient is awake, alert, hemodynamically unremarkable, smiling, pleasantly interactive. Patient's risk profile for CAD is low, though ACS is a consideration, additional considerations include pneumonia, asthma exacerbation, gastroesophageal etiology. Cardiac 95 sinus normal pulse ox 100% room air normal  Amount and/or Complexity of Data Reviewed Independent Historian: caregiver External Data Reviewed: notes. Labs: ordered. Decision-making details documented in ED Course. Radiology: ordered and independent interpretation performed. Decision-making details documented in ED Course. ECG/medicine tests: ordered and independent interpretation performed. Decision-making details documented in ED Course.  Risk OTC drugs. Prescription drug management.   11:28 PM Patient in no distress.  Initial findings discussed with son who is at bedside and daughter via telephone.  Initial findings reassuring with normal troponin, second troponin pending, low suspicion for atypical ACS. Patient found to have elevated lipase, which may be contributory to her epigastric and chest pain.  CT pending.  We discussed discharge possibilities, with normal CT, if second opponent is normal patient appropriate for discharge to follow-up with primary care. Per request Symbicort  refill provided as well.     Final diagnoses:  Atypical chest pain     Garrick Charleston, MD 07/02/24 2329  "

## 2024-07-02 NOTE — ED Provider Notes (Signed)
 Blood pressure 117/77, pulse 90, temperature 98.1 F (36.7 C), temperature source Oral, resp. rate 20, weight 59 kg, SpO2 100%.  Assuming care from Dr. Garrick.  In short, Deborah Moreno is a 84 y.o. female with a chief complaint of Chest Pain .  Refer to the original H&P for additional details.  The current plan of care is to follow up CT abd/pelvis and second troponin.   12:45 AM  Second troponin normal.  Large hiatal hernia noted which may be causing symptoms.  Plan for close PCP follow-up but patient appears stable for discharge.  Discussed case with son at bedside.   Darra Fonda MATSU, MD 07/03/24 [phone]

## 2024-07-02 NOTE — ED Triage Notes (Signed)
 Pt arrives EMS from home with reports of sudden onset chest pain and shortness of breath that started tonight. Pt alert and oriented x3 with hx of dementia. Pt denies any pain at present. Pt has audible wheezing on arrival. Pt refused ASA with EMS.

## 2024-07-03 ENCOUNTER — Emergency Department (HOSPITAL_COMMUNITY)

## 2024-07-03 MED ORDER — SYMBICORT 160-4.5 MCG/ACT IN AERO: 2.0000 | INHALATION_SPRAY | Freq: Every day | RESPIRATORY_TRACT | 1 refills | Status: AC

## 2024-07-03 MED ORDER — FAMOTIDINE 40 MG PO TABS: 40.0000 mg | ORAL_TABLET | Freq: Every day | ORAL | 0 refills | Status: AC

## 2024-07-03 MED ORDER — IOHEXOL 350 MG/ML SOLN
75.0000 mL | Freq: Once | INTRAVENOUS | Status: AC | PRN
  Administered 2024-07-03: 75 mL via INTRAVENOUS

## 2024-07-03 NOTE — Discharge Instructions (Signed)
